# Patient Record
Sex: Male | Born: 1955 | Race: White | Hispanic: No | Marital: Single | Smoking: Former smoker
Health system: Southern US, Community
[De-identification: ages and names within clinical notes are randomized; demographics above are authoritative.]

## PROBLEM LIST (undated history)

## (undated) DIAGNOSIS — Z87442 Personal history of urinary calculi: Secondary | ICD-10-CM

## (undated) DIAGNOSIS — M1990 Unspecified osteoarthritis, unspecified site: Secondary | ICD-10-CM

## (undated) DIAGNOSIS — M4807 Spinal stenosis, lumbosacral region: Secondary | ICD-10-CM

## (undated) HISTORY — PX: JOINT REPLACEMENT: SHX530

## (undated) HISTORY — PX: OTHER SURGICAL HISTORY: SHX169

## (undated) HISTORY — PX: TONSILLECTOMY: SUR1361

## (undated) HISTORY — DX: Spinal stenosis, lumbosacral region: M48.07

---

## 2013-08-08 DIAGNOSIS — E785 Hyperlipidemia, unspecified: Secondary | ICD-10-CM | POA: Insufficient documentation

## 2018-02-25 ENCOUNTER — Encounter: Payer: Self-pay | Admitting: Sports Medicine

## 2018-02-25 ENCOUNTER — Ambulatory Visit (INDEPENDENT_AMBULATORY_CARE_PROVIDER_SITE_OTHER): Payer: Commercial Managed Care - PPO

## 2018-02-25 ENCOUNTER — Other Ambulatory Visit: Payer: Self-pay | Admitting: Sports Medicine

## 2018-02-25 ENCOUNTER — Telehealth: Payer: Self-pay | Admitting: Sports Medicine

## 2018-02-25 ENCOUNTER — Ambulatory Visit: Payer: Commercial Managed Care - PPO | Admitting: Sports Medicine

## 2018-02-25 ENCOUNTER — Other Ambulatory Visit: Payer: Self-pay

## 2018-02-25 VITALS — BP 145/100 | HR 84 | Resp 16 | Ht 79.0 in | Wt 260.0 lb

## 2018-02-25 DIAGNOSIS — M79672 Pain in left foot: Secondary | ICD-10-CM

## 2018-02-25 DIAGNOSIS — S92902A Unspecified fracture of left foot, initial encounter for closed fracture: Secondary | ICD-10-CM

## 2018-02-25 DIAGNOSIS — S92902K Unspecified fracture of left foot, subsequent encounter for fracture with nonunion: Secondary | ICD-10-CM

## 2018-02-25 DIAGNOSIS — M19072 Primary osteoarthritis, left ankle and foot: Secondary | ICD-10-CM | POA: Diagnosis not present

## 2018-02-25 DIAGNOSIS — T8484XA Pain due to internal orthopedic prosthetic devices, implants and grafts, initial encounter: Secondary | ICD-10-CM

## 2018-02-25 NOTE — Progress Notes (Addendum)
Subjective: Todd May is a 62 y.o. male patient who presents to office for evaluation of left foot pain. Patient complains of progressive pain especially over the last 4 years reports that his foot was fused by podiatrist from [place] and states that over the last 2-1/2 years the pain has slowly worsened from 4-6 to now 8-10 out of 10 pain states that the pain is sharp and constant and is starting to interfere with his daily activities even work states that overall he enjoys being very active and does not want to slow down but he feels like the pain in his foot and the broken hardware that he has is causing more pain and he is concerned and feels like he needs to get the area repaired.  Patient reports that he also had a shoulder issue which when he was given a medication for to help with pain that seem to help and is requesting me to give him something today for pain.  Patient currently is taking diclofenac which helps some but states that when the pain gets severe he feels like he needs something else to help with his pain.  Review of Systems  Musculoskeletal: Positive for joint pain and myalgias.  All other systems reviewed and are negative.   There are no active problems to display for this patient.   No current outpatient medications on file prior to visit.   No current facility-administered medications on file prior to visit.     Not on File  Objective:  General: Alert and oriented x3 in no acute distress  Dermatology: Surgical scar well-healed, no open lesions bilateral lower extremities, no webspace macerations, no ecchymosis bilateral, all nails x 10 are well manicured.  Vascular: Dorsalis Pedis and Posterior Tibial pedal pulses palpable, Capillary Fill Time 5 seconds, scant pedal hair growth bilateral, no edema bilateral lower extremities, Temperature gradient within normal limits.  Neurology: Michaell Cowing sensation intact via light touch bilateral.  Musculoskeletal: Mild  tenderness with palpation dorsal medial midfoot on left, no pain with calf compression bilateral. There is decreased midfoot range of motion with dorsal prominence.  Strength within normal limits in all groups bilateral.   Gait: Antalgic gait  Xrays  Left foot   Impression: Hardware dorsal midfoot with 2 broken screws distally within his plate there is dorsal and medial bone spurs around the area of hardware and an inferior calcaneal spur present.  Mild soft tissue swelling.  No other acute findings.  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, subsequent encounter       Arthritis of left foot           -Complete examination performed -Xrays reviewed -Discussed treatement options for pain around previous surgical site with a likely possible nonunion bone spurs and arthritis -Rx CT with 3D reconstruction advised patient based on the results of this will determine what type of procedure he may benefit best from could be simple hardware removal to revision with refusion of the surgical site -Advised patient to continue with diclofenac rest ice elevation and good supportive shoes; patient may call office with the name of the other medication that the other doctor gave him for the shoulder that seem to help his foot pain for me to review to see if this is something that I am comfortable with refilling  for him; Discussed that I can not give a Narcotic for his pain; offered Tramadol or changing anti-inflammatory but he refused and said it doesn't work -Patient to return to office after CT or sooner if condition worsens.  Asencion Islamitorya Rita Prom, DPM

## 2018-02-25 NOTE — Telephone Encounter (Signed)
That is a Narcotic. I told patient that I could not fill narcotic but I did offer tramadol. I told him that narcotic was reserved for surgery patients not for patient that has had pain for years. -Dr. Marylene LandStover

## 2018-02-25 NOTE — Progress Notes (Signed)
   Subjective:    Patient ID: Todd May, male    DOB: December 03, 1955, 62 y.o.   MRN: [id number]  HPI    Review of Systems  Musculoskeletal: Positive for arthralgias, joint swelling and myalgias.  All other systems reviewed and are negative.      Objective:   Physical Exam        Assessment & Plan:

## 2018-02-25 NOTE — Telephone Encounter (Signed)
"   The medication I took in the past was, Dilaudid  2mg ., I had 30 pills.  I would like to have it filled at [facility] [address] in [place].

## 2018-02-28 ENCOUNTER — Telehealth: Payer: Self-pay | Admitting: *Deleted

## 2018-02-28 ENCOUNTER — Telehealth: Payer: Self-pay | Admitting: Sports Medicine

## 2018-02-28 DIAGNOSIS — M19079 Primary osteoarthritis, unspecified ankle and foot: Secondary | ICD-10-CM

## 2018-02-28 NOTE — Telephone Encounter (Signed)
Pt had X-rays done Friday and was supposed to have prescription sent in for pain and pharmacy has not received the prescription. Please give pt a call

## 2018-02-28 NOTE — Telephone Encounter (Signed)
Orders to J. Quintana, RN for pre-cert. 

## 2018-02-28 NOTE — Telephone Encounter (Signed)
error 

## 2018-02-28 NOTE — Telephone Encounter (Signed)
-----   Message from Asencion Islamitorya Stover, [place] sent at 02/25/2018  8:47 AM EST ----- Regarding: CT scan with 3D reconstruction History of hardware at left midfoot with continued pain 8-10 out of 10 was fused about 4 years ago with no improvement in symptoms please evaluate for complete fusion at midfoot

## 2018-02-28 NOTE — Telephone Encounter (Signed)
I informed pt of 02/25/2018 message of Telephone Call from Dr. Marylene LandStover, that she would not prescribe the dilaudid, and she would send the tramadol. Pt states he told Dr. Marylene LandStover the dilaudid was from another doctor for his shoulder and that was what he last had, the tramadol doesn't help and he had had hydrocodone before. Pt states his foot is killing him and he wants something so he doesn't hurt. I told pt I would inform Dr. Marylene LandStover, and call again.

## 2018-03-01 NOTE — Telephone Encounter (Signed)
Returned patient phone call. Patient requesting pain medication. I told patient that I can not give a NARCOTIC for his chronic pain related to hardware that was placed years ago. Patient continues to talk and say that I am calling him a liar and that I looked in his chart and saw that 4 years ago he was on pain medications. I re-iterated to the patient that I again can only give a anti-inflammatory or Tramadol for the pain. Patient continued to insult me and the profession. I re-stated to patient that for his pain I can not give him a NARCOTIC. I told patient if his pain is that severe he can go to ER or Urgent care. Politely I told patient to have a good day and ended the conversation.

## 2018-03-04 NOTE — Telephone Encounter (Signed)
I called to inform pt of the CT. Pt states he can't do it, he doesn't trust Dr. Marylene LandStover and he doesn't think she knows what she is doing. I told pt, I had heard enough and would cancel his procedure, and I hung up the phone.

## 2018-03-04 NOTE — Telephone Encounter (Signed)
[facility]aret[facility] scheduled pt for CT of left lower extremity on 03/08/2018 arrival 2:15pm for 2:45pm testing.

## 2018-03-04 NOTE — Telephone Encounter (Signed)
I cancelled the procedure at [facility] with Jamesetta OrleansEricka.

## 2018-05-06 ENCOUNTER — Other Ambulatory Visit: Payer: Self-pay | Admitting: Orthopedic Surgery

## 2018-05-06 DIAGNOSIS — M545 Low back pain, unspecified: Secondary | ICD-10-CM

## 2018-05-09 ENCOUNTER — Other Ambulatory Visit: Payer: Self-pay | Admitting: Orthopedic Surgery

## 2018-05-11 ENCOUNTER — Other Ambulatory Visit: Payer: Self-pay

## 2018-05-16 ENCOUNTER — Other Ambulatory Visit: Payer: Self-pay

## 2018-06-10 ENCOUNTER — Telehealth: Payer: Self-pay | Admitting: Neurology

## 2018-06-10 ENCOUNTER — Ambulatory Visit: Payer: Commercial Managed Care - PPO | Admitting: Neurology

## 2018-06-10 ENCOUNTER — Other Ambulatory Visit: Payer: Self-pay

## 2018-06-10 ENCOUNTER — Encounter: Payer: Self-pay | Admitting: Neurology

## 2018-06-10 VITALS — BP 153/95 | HR 85 | Ht 77.0 in | Wt 249.0 lb

## 2018-06-10 DIAGNOSIS — M21372 Foot drop, left foot: Secondary | ICD-10-CM

## 2018-06-10 DIAGNOSIS — M6281 Muscle weakness (generalized): Secondary | ICD-10-CM

## 2018-06-10 DIAGNOSIS — M21371 Foot drop, right foot: Secondary | ICD-10-CM

## 2018-06-10 NOTE — Telephone Encounter (Signed)
I called the patient.  The MRI of the cervical and thoracic spine did not show evidence of spinal cord impingement.  The lower extremity weakness is likely related to the lumbosacral spinal stenosis.  He will be worked in next week for EMG evaluation.

## 2018-06-10 NOTE — Progress Notes (Signed)
Reason for visit: Lumbosacral spinal stenosis, leg weakness  Referring physician: Dr. Roseanna Rainbow is a 63 y.o. male  History of present illness:  Mr. Pennick is a 63 year old right-handed white male with a history of onset of right shoulder pain and discomfort that occurred in August 2019 after he loaded some luggage into the car.  The patient heard a pop in the shoulder with some pain in the shoulder and in between the shoulder blade.  The patient denies any neck pain per se.  The patient seemed to recover from this, but in November 2019 he was putting in a fire pit and he began noticed some numbness and weakness of the legs.  The patient has had gradual worsening of the leg strength, he has developed bilateral foot drops and has weakness with hip flexion bilaterally.  The patient underwent lumbar MRI study that showed evidence of severe lumbosacral spinal stenosis at L2-3 and L3-4 levels and moderate stenosis at L4-5 levels.  Multilevel foraminal stenosis was noted.  The actual MRI disc is not available for my review.  The patient has been sent for MRI of the cervical and thoracic spine, there does appear to be a disc bulge and spinal stenosis at the C4-5 level.  The formal report not available, I do not see definite spinal cord injury at this level.  Evaluation of the thoracic spine by my review shows no evidence of spinal cord impingement.  The patient has had ongoing weakness of the legs, he reports 1 fall.  He reports some weakness and discomfort in the legs with walking, he has to rest frequently.  He has numbness in both legs.  He does have some mild change in his bowel bladder, he has some mild constipation, but no incontinence.  The patient is sent to this office for further evaluation.   History reviewed. No pertinent past medical history.  Past Surgical History:  Procedure Laterality Date  . arthroscopic knee surgery left    . TONSILLECTOMY      Family History   Problem Relation Age of Onset  . Cancer - Colon Father   . Obesity Sister   . Heart disease Sister     Social history:  reports that he has never smoked. He has never used smokeless tobacco. No history on file for alcohol and drug.  Medications:  Prior to Admission medications   Medication Sig Start Date End Date Taking? Authorizing Provider  acetaminophen-codeine (TYLENOL #3) 300-30 MG tablet Take 1 tablet by mouth 4 (four) times daily as needed. 06/01/18   [provider]  amitriptyline (ELAVIL) 25 MG tablet  02/07/18   [provider]  cyclobenzaprine (FLEXERIL) 5 MG tablet Take 5 mg by mouth daily. 05/12/18   [provider]  diclofenac (VOLTAREN) 75 MG EC tablet  02/07/18   [provider]  meloxicam (MOBIC) 15 MG tablet Take 15 mg by mouth daily. 05/23/18   [provider]  methocarbamol (ROBAXIN) 500 MG tablet Take 500 mg by mouth 3 (three) times daily. 05/25/18   [provider]     No Known Allergies  ROS:  Out of a complete 14 system review of symptoms, the patient complains only of the following symptoms, and all other reviewed systems are negative.  Joint pain, muscle cramps, aching muscles Insomnia, restless legs  Blood pressure (!) 153/95, pulse 85, height 6\' 5"  (1.956 m), weight 249 lb (112.9 kg).  Physical Exam  General: The patient is alert  and cooperative at the time of the examination.  Eyes: Pupils are equal, round, and reactive to light. Discs are flat bilaterally.  Neck: The neck is supple, no carotid bruits are noted.  Respiratory: The respiratory examination is clear.  Cardiovascular: The cardiovascular examination reveals a regular rate and rhythm, no obvious murmurs or rubs are noted.  Skin: Extremities are without significant edema.  Neurologic Exam  Mental status: The patient is alert and oriented x 3 at the time of the examination. The patient has apparent normal recent and remote memory, with  an apparently normal attention span and concentration ability.  Cranial nerves: Facial symmetry is present. There is good sensation of the face to pinprick and soft touch bilaterally. The strength of the facial muscles and the muscles to head turning and shoulder shrug are normal bilaterally. Speech is well enunciated, no aphasia or dysarthria is noted. Extraocular movements are full. Visual fields are full. The tongue is midline, and the patient has symmetric elevation of the soft palate. No obvious hearing deficits are noted.  Motor: The motor testing reveals 5 over 5 strength of the upper extremities, with exception of some weakness with external rotation of the right arm.  With the lower extremities, there is 4/5 strength with hip flexion bilaterally and prominent foot drops with weakness with plantar flexion, inversion, and eversion of the feet as well.  Flexion and extension of the knees is relatively good strength.  Good symmetric motor tone is noted throughout.  Sensory: Sensory testing is intact to pinprick, soft touch, vibration sensation, and position sense on all 4 extremities, with exception some decreased position sense in the right foot. No evidence of extinction is noted.  Coordination: Cerebellar testing reveals good finger-nose-finger and heel-to-shin bilaterally.  Gait and station: Gait is wide-based, slightly unsteady.  The patient has a steppage gait pattern bilaterally.  Reflexes: Deep tendon reflexes are symmetric, but are depressed bilaterally.  The patient has maintained but depressed reflexes in the knees bilaterally, ankle jerk reflexes are absent bilaterally.  Toes are downgoing bilaterally.   Assessment/Plan:  1.  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  Progressive lower extremity weakness and numbness, likely related to lumbosacral spinal stenosis  The MRI of the thoracic spine and cervical spine I do not believe shows an etiology for his  current symptoms.  The patient likely has progressive weakness related to the lumbosacral spinal stenosis and likely will require decompressive surgery.  The patient will be set up for some blood work today, he will have EMG and nerve conduction study evaluation.  Addendum: I have received the written reports of MRI of the cervical and thoracic spine.  MRI of the thoracic spine shows a disc protrusion at the T9-T10 level effacing the thecal sac, no evidence of spinal cord compression was seen.  MRI of the cervical spine reveals evidence of a disc osteophyte complex at the C4-5 level that is causing moderate spinal stenosis with severe left and moderate right neuroforaminal stenosis.  There is severe bilateral neuroforaminal stenosis at the C3-4 level and moderate neuroforaminal stenosis at the C5-6 level on the right, mild on the left.  There is severe right and mild left neuroforaminal stenosis at the C6-7 level and moderate left neuroforaminal stenosis at the C2-3 level.  There is no evidence of spinal cord compression.   Marlan Palau MD 06/10/2018 11:17 AM  [facility] [address] [place], [place] [address]  Phone [phone] Fax  [phone]  

## 2018-06-13 LAB — MULTIPLE MYELOMA PANEL, SERUM
Albumin SerPl Elph-Mcnc: 3.9 g/dL (ref 2.9–4.4)
Albumin/Glob SerPl: 1.4 (ref 0.7–1.7)
Alpha 1: 0.3 g/dL (ref 0.0–0.4)
Alpha2 Glob SerPl Elph-Mcnc: 0.8 g/dL (ref 0.4–1.0)
B-Globulin SerPl Elph-Mcnc: 1.2 g/dL (ref 0.7–1.3)
Gamma Glob SerPl Elph-Mcnc: 0.8 g/dL (ref 0.4–1.8)
Globulin, Total: 3 g/dL (ref 2.2–3.9)
IgA/Immunoglobulin A, Serum: 193 mg/dL (ref 61–437)
IgG (Immunoglobin G), Serum: 776 mg/dL (ref 700–1600)
IgM (Immunoglobulin M), Srm: 144 mg/dL (ref 20–172)
TOTAL PROTEIN: 6.9 g/dL (ref 6.0–8.5)

## 2018-06-13 LAB — ANA W/REFLEX: ANA: NEGATIVE

## 2018-06-13 LAB — B. BURGDORFI ANTIBODIES: Lyme IgG/IgM Ab: <0.91 {ISR} (ref 0.00–0.90)

## 2018-06-13 LAB — VITAMIN B12: Vitamin B-12: 1750 pg/mL — ABNORMAL HIGH (ref 232–1245)

## 2018-06-13 LAB — SEDIMENTATION RATE: SED RATE: 22 mm/h (ref 0–30)

## 2018-06-13 LAB — CK: Total CK: 298 U/L — ABNORMAL HIGH (ref 24–204)

## 2018-06-14 ENCOUNTER — Ambulatory Visit: Payer: Commercial Managed Care - PPO | Admitting: Neurology

## 2018-06-14 ENCOUNTER — Encounter: Payer: Self-pay | Admitting: Neurology

## 2018-06-14 ENCOUNTER — Other Ambulatory Visit: Payer: Self-pay

## 2018-06-14 ENCOUNTER — Ambulatory Visit (INDEPENDENT_AMBULATORY_CARE_PROVIDER_SITE_OTHER): Payer: Commercial Managed Care - PPO | Admitting: Neurology

## 2018-06-14 DIAGNOSIS — M4807 Spinal stenosis, lumbosacral region: Secondary | ICD-10-CM | POA: Insufficient documentation

## 2018-06-14 DIAGNOSIS — M6281 Muscle weakness (generalized): Secondary | ICD-10-CM | POA: Diagnosis not present

## 2018-06-14 HISTORY — DX: Spinal stenosis, lumbosacral region: M48.07

## 2018-06-14 NOTE — Procedures (Signed)
     HISTORY:  Todd May is a 63 year old gentleman with a history of onset of weakness in both legs.  He has been found to have a 2 level issue with spinal stenosis in the lumbar spine, the patient is being evaluated for this issue.  NERVE CONDUCTION STUDIES:  Nerve conduction studies were performed on both lower extremities.  The distal motor latencies for the peroneal and posterior tibial nerves were prolonged bilaterally with low motor amplitudes seen for these nerves bilaterally.  The nerve conduction velocities shows significant slowing for the peroneal and posterior tibial nerves bilaterally.  The sensory latencies for the sural nerves were borderline normal on the right and slightly prolonged on the left with prolongation of the right peroneal sensory latency and an unobtainable left peroneal sensory latency.  The F-wave latencies for the posterior tibial nerves were prolonged bilaterally.  EMG STUDIES:  EMG study was performed on the right lower extremity:  The tibialis anterior muscle reveals 2 to 4K motor units with decreased recruitment. 1+ fibrillations and positive waves were seen. The peroneus tertius muscle reveals 2 to 5K motor units with decreased recruitment. 2+ fibrillations and positive waves were seen. The medial gastrocnemius muscle reveals 1 to 4K motor units with decreased recruitment. 2+ fibrillations and positive waves were seen. The vastus lateralis muscle reveals 2 to 4K motor units with decreased recruitment. No fibrillations or positive waves were seen. The iliopsoas muscle reveals 2 to 5K motor units with slightly decreased recruitment. No fibrillations or positive waves were seen. The biceps femoris muscle (long head) reveals 2 to 4K motor units with decreased recruitment. 2+ fibrillations or positive waves were seen. The lumbosacral paraspinal muscles were tested at 3 levels, and revealed no abnormalities of insertional activity at all 3 levels tested.  There was good relaxation.   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  EMG evaluation of the right lower extremity shows denervation that is acute and chronic in the S1 level primarily, also involving the L5 level to some degree.  Less severe but chronic neuropathic denervation is also seen in the L4 and L3 levels.  EMG evaluation again would be consistent with lumbosacral spinal stenosis.   Todd Palau MD 06/14/2018 2:42 PM  [facility] [address] [place], [place] [address]  Phone [phone] Fax [phone]

## 2018-06-14 NOTE — Progress Notes (Signed)
The patient comes in for EMG nerve conduction studies today.  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  Overall, the study is most consistent with the findings associated with the spinal stenosis.  Blood work is unremarkable with exception that the CK enzyme levels were mildly elevated likely related to acute active neuropathic denervation.  The patient is indicating that he is having some change in bowel and bladder function recently, considering decompressive surgery of the lumbosacral spinal stenosis is therefore indicated.      MNC    Nerve / Sites Muscle Latency Ref. Amplitude Ref. Rel Amp Segments Distance Velocity Ref. Area    ms ms mV mV %  cm m/s m/s mVms  R Peroneal - EDB     Ankle EDB 7.8 ?6.5 0.4 ?2.0 100 Ankle - EDB 9   1.9     Fib head EDB 18.8  0.3  81.4 Fib head - Ankle 35 32 ?44 2.0     Pop fossa EDB 22.0  0.5  169 Pop fossa - Fib head 10 31 ?44 4.0         Pop fossa - Ankle      L Peroneal - EDB     Ankle EDB 8.6 ?6.5 0.3 ?2.0 100 Ankle - EDB 9   1.4     Fib head EDB 21.8  0.2  78.5 Fib head - Ankle 34 26 ?44 0.9     Pop fossa EDB 25.8  0.3  141 Pop fossa - Fib head 10 25 ?44 1.0         Pop fossa - Ankle      R Tibial - AH     Ankle AH 9.2 ?5.8 0.8 ?4.0 100 Ankle - AH 9   3.0     Pop fossa AH 23.5  0.6  72.7 Pop fossa - Ankle 45 32 ?41 1.6  L Tibial - AH     Ankle AH 7.4 ?5.8 1.7 ?4.0 100 Ankle - AH 9   5.6     Pop fossa AH 20.8  1.6  97.1 Pop fossa - Ankle 42 31 ?41 4.9             SNC    Nerve / Sites Rec. Site Peak Lat Ref.  Amp Ref. Segments Distance    ms ms V V  cm  R Sural - Ankle (Calf)     Calf Ankle 4.4 ?4.4 8 ?6 Calf - Ankle 14  L Sural - Ankle (Calf)     Calf Ankle 4.8 ?4.4 5 ?6 Calf - Ankle 14  R Superficial peroneal - Ankle     Lat leg Ankle 5.2 ?4.4 3 ?6 Lat leg - Ankle 14  L Superficial peroneal  - Ankle     Lat leg Ankle NR ?4.4 NR ?6 Lat leg - Ankle 14              F  Wave    Nerve F Lat Ref.   ms ms  R Tibial - AH 85.0 ?56.0  L Tibial - AH 66.8 ?56.0

## 2018-06-14 NOTE — Progress Notes (Signed)
Please refer to EMG and nerve conduction procedure note.  

## 2018-06-20 ENCOUNTER — Other Ambulatory Visit: Payer: Self-pay | Admitting: Orthopedic Surgery

## 2018-06-22 NOTE — Pre-Procedure Instructions (Signed)
Todd May  06/22/2018        Your procedure is scheduled on Wednesday 06/29/18 from 0830-1420pm  Report to [facility] ,Entrance A at 0630 A.M.  Call this number if you have problems the morning of surgery:  [phone]   Remember:  Do not eat  after midnight.  You may drink clear liquids until 0530 AM.  Clear liquids allowed are: Water, Juice (non-citric and without pulp), Carbonated beverages, Clear Tea, Black Coffee only, Plain Jell-O only, Gatorade and Plain Popsicles only. Complete the Ensure Pre-surgery drink by 05:30am. Please, if able, drink it in one setting. DO NOT SIP.   Take these medicines the morning of surgery with A SIP OF WATER :              Methocarbamol (ROBAXIN)              As needed : Acetaminophen-codeine (tylenol #3)  As of today  STOP taking Diclofenac(VOLTAREN),  Meloxicam (MOBIC) , Aspirin (unless otherwise instructed by your surgeon), Aleve, Naproxen, Ibuprofen, Motrin, Advil, Goody's, BC's, all herbal medications, fish oil, and all vitamins.    Do not wear jewelry, make-up or nail polish.  Do not wear lotions, powders, or perfumes, or deodorant.  Do not shave 48 hours prior to surgery.  Men may shave face and neck.  Do not bring valuables to the hospital.  [facility] is not responsible for any belongings or valuables.  Contacts, dentures or bridgework may not be worn into surgery.  Leave your suitcase in the car.  After surgery it may be brought to your room.  For patients admitted to the hospital, discharge time will be determined by your treatment team.  Patients discharged the day of surgery will not be allowed to drive home.   Special instructions: [facility]- Preparing For Surgery  Before surgery, you can play an important role. Because skin is not sterile, your skin needs to be as free of germs as possible. You can reduce the number of germs on your skin by washing with CHG (chlorahexidine gluconate) Soap before surgery.   CHG is an antiseptic cleaner which kills germs and bonds with the skin to continue killing germs even after washing.    Oral Hygiene is also important to reduce your risk of infection.  Remember - BRUSH YOUR TEETH THE MORNING OF SURGERY WITH YOUR REGULAR TOOTHPASTE  Please do not use if you have an allergy to CHG or antibacterial soaps. If your skin becomes reddened/irritated stop using the CHG.  Do not shave (including legs and underarms) for at least 48 hours prior to first CHG shower. It is OK to shave your face.  Please follow these instructions carefully.   1. Shower the NIGHT BEFORE SURGERY and the MORNING OF SURGERY with CHG.   2. If you chose to wash your hair, wash your hair first as usual with your normal shampoo.  3. After you shampoo, rinse your hair and body thoroughly to remove the shampoo.  4. Use CHG as you would any other liquid soap. You can apply CHG directly to the skin and wash gently with a scrungie or a clean washcloth.   5. Apply the CHG Soap to your body ONLY FROM THE NECK DOWN.  Do not use on open wounds or open sores. Avoid contact with your eyes, ears, mouth and genitals (private parts). Wash Face and genitals (private parts)  with your normal soap.  6. Wash thoroughly, paying special attention to the  area where your surgery will be performed.  7. Thoroughly rinse your body with warm water from the neck down.  8. DO NOT shower/wash with your normal soap after using and rinsing off the CHG Soap.  9. Pat yourself dry with a CLEAN TOWEL.  10. Wear CLEAN PAJAMAS to bed the night before surgery, wear comfortable clothes the morning of surgery  11. Place CLEAN SHEETS on your bed the night of your first shower and DO NOT SLEEP WITH PETS.  Day of Surgery:  Do not apply any deodorants/lotions.  Please wear clean clothes to the hospital/surgery center.   Remember to brush your teeth WITH YOUR REGULAR TOOTHPASTE.  Please read over the following fact sheets that  you were given. Pain Booklet, MRSA Information and Surgical Site Infection Prevention

## 2018-06-24 ENCOUNTER — Other Ambulatory Visit: Payer: Self-pay

## 2018-06-24 ENCOUNTER — Encounter (HOSPITAL_COMMUNITY): Payer: Self-pay

## 2018-06-24 ENCOUNTER — Encounter (HOSPITAL_COMMUNITY)
Admission: RE | Admit: 2018-06-24 | Discharge: 2018-06-24 | Disposition: A | Discharge status: Home / Self Care | Payer: Commercial Managed Care - PPO | Source: Ambulatory Visit | Attending: Orthopedic Surgery | Admitting: Orthopedic Surgery

## 2018-06-24 DIAGNOSIS — Z01812 Encounter for preprocedural laboratory examination: Secondary | ICD-10-CM | POA: Insufficient documentation

## 2018-06-24 HISTORY — DX: Personal history of urinary calculi: Z87.442

## 2018-06-24 HISTORY — DX: Unspecified osteoarthritis, unspecified site: M19.90

## 2018-06-24 LAB — CBC WITH DIFFERENTIAL/PLATELET
Abs Immature Granulocytes: 0.03 10*3/uL (ref 0.00–0.07)
Basophils Absolute: 0 10*3/uL (ref 0.0–0.1)
Basophils Relative: 0 %
Eosinophils Absolute: 0.1 10*3/uL (ref 0.0–0.5)
Eosinophils Relative: 1 %
HCT: 50.5 % (ref 39.0–52.0)
Hemoglobin: 15.9 g/dL (ref 13.0–17.0)
Immature Granulocytes: 0 %
Lymphocytes Relative: 13 %
Lymphs Abs: 1 10*3/uL (ref 0.7–4.0)
MCH: 27.6 pg (ref 26.0–34.0)
MCHC: 31.5 g/dL (ref 30.0–36.0)
MCV: 87.5 fL (ref 80.0–100.0)
Monocytes Absolute: 0.5 10*3/uL (ref 0.1–1.0)
Monocytes Relative: 6 %
Neutro Abs: 6.2 10*3/uL (ref 1.7–7.7)
Neutrophils Relative %: 80 %
Platelets: 250 10*3/uL (ref 150–400)
RBC: 5.77 MIL/uL (ref 4.22–5.81)
RDW: 13.5 % (ref 11.5–15.5)
WBC: 7.9 10*3/uL (ref 4.0–10.5)
nRBC: 0 % (ref 0.0–0.2)

## 2018-06-24 LAB — COMPREHENSIVE METABOLIC PANEL
ALT: 28 U/L (ref 0–44)
AST: 27 U/L (ref 15–41)
Albumin: 4.3 g/dL (ref 3.5–5.0)
Alkaline Phosphatase: 98 U/L (ref 38–126)
Anion gap: 10 (ref 5–15)
BUN: 20 mg/dL (ref 8–23)
CO2: 25 mmol/L (ref 22–32)
Calcium: 9.1 mg/dL (ref 8.9–10.3)
Chloride: 104 mmol/L (ref 98–111)
Creatinine, Ser: 0.99 mg/dL (ref 0.61–1.24)
GFR calc Af Amer: >60 mL/min (ref >60)
GFR calc non Af Amer: >60 mL/min (ref >60)
Glucose, Bld: 100 mg/dL — ABNORMAL HIGH (ref 70–99)
Potassium: 4 mmol/L (ref 3.5–5.1)
Sodium: 139 mmol/L (ref 135–145)
Total Bilirubin: 0.7 mg/dL (ref 0.3–1.2)
Total Protein: 7.1 g/dL (ref 6.5–8.1)

## 2018-06-24 LAB — URINALYSIS, ROUTINE W REFLEX MICROSCOPIC
Bilirubin Urine: NEGATIVE
Glucose, UA: NEGATIVE mg/dL
Hgb urine dipstick: NEGATIVE
Ketones, ur: NEGATIVE mg/dL
Leukocytes,Ua: NEGATIVE
Nitrite: NEGATIVE
Protein, ur: NEGATIVE mg/dL
Specific Gravity, Urine: 1.025 (ref 1.005–1.030)
pH: 6 (ref 5.0–8.0)

## 2018-06-24 LAB — ABO/RH: ABO/RH(D): B POS

## 2018-06-24 LAB — TYPE AND SCREEN
ABO/RH(D): B POS
Antibody Screen: NEGATIVE

## 2018-06-24 LAB — PROTIME-INR
INR: 1 (ref 0.8–1.2)
Prothrombin Time: 12.9 seconds (ref 11.4–15.2)

## 2018-06-24 LAB — SURGICAL PCR SCREEN
MRSA, PCR: NEGATIVE
Staphylococcus aureus: NEGATIVE

## 2018-06-24 LAB — APTT: aPTT: 28 seconds (ref 24–36)

## 2018-06-24 NOTE — Progress Notes (Signed)
PCP - denies Cardiologist - denies  Chest x-ray -  N/A EKG - N/A Stress Test - denies ECHO - denies Cardiac Cath - denies  Sleep Study - denies CPAP - N/a  Blood Thinner Instructions: N/A Aspirin Instructions: per pt. 06/24/2018  Anesthesia review: No  Coronavirus Screening  Have you experienced the following symptoms:  Cough yes/no: No Fever (>100.73F)  yes/no: No Runny nose yes/no: No Sore throat yes/no: No Difficulty breathing/shortness of breath  yes/no: No  Have you or a family member traveled in the last 14 days and where? yes/no: No  If the patient indicates "YES" to the above questions, their PAT will be rescheduled to limit the exposure to others and, the surgeon will be notified. THE PATIENT WILL NEED TO BE ASYMPTOMATIC FOR 14 DAYS.   If the patient is not experiencing any of these symptoms, the PAT nurse will instruct them to NOT bring anyone with them to their appointment since they may have these symptoms or traveled as well.   Please remind your patients and families that hospital visitation restrictions are in effect and the importance of the restrictions.   Patient denies shortness of breath, fever, cough and chest pain at PAT appointment  Patient verbalized understanding of instructions that were given to them at the PAT appointment. Patient was also instructed that they will need to review over the PAT instructions again at home before surgery.

## 2018-06-29 ENCOUNTER — Ambulatory Visit (HOSPITAL_COMMUNITY): Payer: Commercial Managed Care - PPO | Admitting: Vascular Surgery

## 2018-06-29 ENCOUNTER — Encounter (HOSPITAL_COMMUNITY): Payer: Self-pay

## 2018-06-29 ENCOUNTER — Ambulatory Visit (HOSPITAL_COMMUNITY)
Admission: RE | Admit: 2018-06-29 | Discharge: 2018-06-30 | Disposition: A | Discharge status: Home / Self Care | Payer: Commercial Managed Care - PPO | Attending: Orthopedic Surgery | Admitting: Orthopedic Surgery

## 2018-06-29 ENCOUNTER — Other Ambulatory Visit: Payer: Self-pay

## 2018-06-29 ENCOUNTER — Ambulatory Visit (HOSPITAL_COMMUNITY)
Admission: RE | Disposition: A | Discharge status: Home / Self Care | Payer: Self-pay | Source: Home / Self Care | Attending: Orthopedic Surgery

## 2018-06-29 ENCOUNTER — Ambulatory Visit (HOSPITAL_COMMUNITY): Payer: Commercial Managed Care - PPO | Admitting: Anesthesiology

## 2018-06-29 ENCOUNTER — Ambulatory Visit (HOSPITAL_COMMUNITY): Payer: Commercial Managed Care - PPO

## 2018-06-29 DIAGNOSIS — Z79899 Other long term (current) drug therapy: Secondary | ICD-10-CM | POA: Diagnosis not present

## 2018-06-29 DIAGNOSIS — Z87891 Personal history of nicotine dependence: Secondary | ICD-10-CM | POA: Insufficient documentation

## 2018-06-29 DIAGNOSIS — M1990 Unspecified osteoarthritis, unspecified site: Secondary | ICD-10-CM | POA: Diagnosis not present

## 2018-06-29 DIAGNOSIS — M48061 Spinal stenosis, lumbar region without neurogenic claudication: Secondary | ICD-10-CM | POA: Insufficient documentation

## 2018-06-29 DIAGNOSIS — Z7982 Long term (current) use of aspirin: Secondary | ICD-10-CM | POA: Insufficient documentation

## 2018-06-29 DIAGNOSIS — M5410 Radiculopathy, site unspecified: Secondary | ICD-10-CM

## 2018-06-29 DIAGNOSIS — Z791 Long term (current) use of non-steroidal anti-inflammatories (NSAID): Secondary | ICD-10-CM | POA: Diagnosis not present

## 2018-06-29 DIAGNOSIS — M4800 Spinal stenosis, site unspecified: Secondary | ICD-10-CM | POA: Diagnosis present

## 2018-06-29 HISTORY — PX: LUMBAR LAMINECTOMY/DECOMPRESSION MICRODISCECTOMY: SHX5026

## 2018-06-29 SURGERY — LUMBAR LAMINECTOMY/DECOMPRESSION MICRODISCECTOMY
Anesthesia: General | Site: Spine Lumbar

## 2018-06-29 MED ORDER — MENTHOL 3 MG MT LOZG: 1.0000 | LOZENGE | OROMUCOSAL | Status: DC | PRN

## 2018-06-29 MED ORDER — METHYLENE BLUE 0.5 % INJ SOLN
INTRAVENOUS | Status: DC | PRN
  Administered 2018-06-29: 1 mL

## 2018-06-29 MED ORDER — CEFAZOLIN SODIUM-DEXTROSE 2-4 GM/100ML-% IV SOLN
2.0000 g | Freq: Three times a day (TID) | INTRAVENOUS | Status: AC
  Administered 2018-06-29 – 2018-06-30 (×2): 2 g via INTRAVENOUS
  Filled 2018-06-29 (×3): qty 100

## 2018-06-29 MED ORDER — PANTOPRAZOLE SODIUM 40 MG IV SOLR
40.0000 mg | Freq: Every day | INTRAVENOUS | Status: DC
  Administered 2018-06-29: 40 mg via INTRAVENOUS
  Filled 2018-06-29: qty 40

## 2018-06-29 MED ORDER — 0.9 % SODIUM CHLORIDE (POUR BTL) OPTIME
TOPICAL | Status: DC | PRN
  Administered 2018-06-29 (×3): 1000 mL

## 2018-06-29 MED ORDER — PHENYLEPHRINE HCL 10 MG/ML IJ SOLN
INTRAMUSCULAR | Status: AC
  Filled 2018-06-29: qty 1

## 2018-06-29 MED ORDER — METHYLENE BLUE 0.5 % INJ SOLN
INTRAVENOUS | Status: AC
  Filled 2018-06-29: qty 10

## 2018-06-29 MED ORDER — FENTANYL CITRATE (PF) 250 MCG/5ML IJ SOLN
INTRAMUSCULAR | Status: AC
  Filled 2018-06-29: qty 5

## 2018-06-29 MED ORDER — PROMETHAZINE HCL 25 MG/ML IJ SOLN: 6.2500 mg | INTRAMUSCULAR | Status: DC | PRN

## 2018-06-29 MED ORDER — BUPIVACAINE-EPINEPHRINE 0.25% -1:200000 IJ SOLN
INTRAMUSCULAR | Status: DC | PRN
  Administered 2018-06-29: 21.5 mL
  Administered 2018-06-29: 8.5 mL

## 2018-06-29 MED ORDER — ONDANSETRON HCL 4 MG/2ML IJ SOLN: 4.0000 mg | Freq: Four times a day (QID) | INTRAMUSCULAR | Status: DC | PRN

## 2018-06-29 MED ORDER — DIAZEPAM 5 MG PO TABS
5.0000 mg | ORAL_TABLET | Freq: Four times a day (QID) | ORAL | Status: DC | PRN
  Administered 2018-06-29 – 2018-06-30 (×2): 5 mg via ORAL
  Filled 2018-06-29 (×2): qty 1

## 2018-06-29 MED ORDER — SODIUM CHLORIDE 0.9 % IV SOLN: 250.0000 mL | INTRAVENOUS | Status: DC

## 2018-06-29 MED ORDER — ACETAMINOPHEN 325 MG PO TABS
650.0000 mg | ORAL_TABLET | ORAL | Status: DC | PRN
  Administered 2018-06-29: 650 mg via ORAL
  Filled 2018-06-29: qty 2

## 2018-06-29 MED ORDER — CEFAZOLIN SODIUM 1 G IJ SOLR
INTRAMUSCULAR | Status: AC
  Filled 2018-06-29: qty 20

## 2018-06-29 MED ORDER — MIDAZOLAM HCL 2 MG/2ML IJ SOLN
INTRAMUSCULAR | Status: DC | PRN
  Administered 2018-06-29: 2 mg via INTRAVENOUS

## 2018-06-29 MED ORDER — SODIUM CHLORIDE 0.9 % IV SOLN
INTRAVENOUS | Status: DC | PRN
  Administered 2018-06-29: 20 ug/min via INTRAVENOUS

## 2018-06-29 MED ORDER — ONDANSETRON HCL 4 MG/2ML IJ SOLN
INTRAMUSCULAR | Status: DC | PRN
  Administered 2018-06-29: 4 mg via INTRAVENOUS

## 2018-06-29 MED ORDER — ROCURONIUM BROMIDE 50 MG/5ML IV SOSY
PREFILLED_SYRINGE | INTRAVENOUS | Status: AC
  Filled 2018-06-29: qty 5

## 2018-06-29 MED ORDER — THROMBIN (RECOMBINANT) 20000 UNITS EX SOLR
CUTANEOUS | Status: AC
  Filled 2018-06-29: qty 20000

## 2018-06-29 MED ORDER — CHLORHEXIDINE GLUCONATE 4 % EX LIQD: 60.0000 mL | Freq: Once | CUTANEOUS | Status: DC

## 2018-06-29 MED ORDER — CEFAZOLIN SODIUM-DEXTROSE 2-4 GM/100ML-% IV SOLN
2.0000 g | INTRAVENOUS | Status: AC
  Administered 2018-06-29 (×2): 2 g via INTRAVENOUS
  Filled 2018-06-29: qty 100

## 2018-06-29 MED ORDER — ONDANSETRON HCL 4 MG PO TABS: 4.0000 mg | ORAL_TABLET | Freq: Four times a day (QID) | ORAL | Status: DC | PRN

## 2018-06-29 MED ORDER — HYDROMORPHONE HCL 1 MG/ML IJ SOLN
0.2500 mg | INTRAMUSCULAR | Status: DC | PRN
  Administered 2018-06-29 (×2): 0.5 mg via INTRAVENOUS

## 2018-06-29 MED ORDER — SENNOSIDES-DOCUSATE SODIUM 8.6-50 MG PO TABS: 1.0000 | ORAL_TABLET | Freq: Every evening | ORAL | Status: DC | PRN

## 2018-06-29 MED ORDER — EPHEDRINE SULFATE-NACL 50-0.9 MG/10ML-% IV SOSY
PREFILLED_SYRINGE | INTRAVENOUS | Status: DC | PRN
  Administered 2018-06-29: 10 mg via INTRAVENOUS
  Administered 2018-06-29 (×2): 5 mg via INTRAVENOUS

## 2018-06-29 MED ORDER — OXYCODONE HCL 5 MG PO TABS: 5.0000 mg | ORAL_TABLET | Freq: Once | ORAL | Status: DC | PRN

## 2018-06-29 MED ORDER — LACTATED RINGERS IV SOLN
INTRAVENOUS | Status: DC | PRN
  Administered 2018-06-29 (×2): via INTRAVENOUS

## 2018-06-29 MED ORDER — METHYLPREDNISOLONE ACETATE 40 MG/ML IJ SUSP
INTRAMUSCULAR | Status: AC
  Filled 2018-06-29: qty 1

## 2018-06-29 MED ORDER — OXYCODONE HCL 5 MG/5ML PO SOLN: 5.0000 mg | Freq: Once | ORAL | Status: DC | PRN

## 2018-06-29 MED ORDER — GLYCOPYRROLATE 0.2 MG/ML IJ SOLN
INTRAMUSCULAR | Status: DC | PRN
  Administered 2018-06-29 (×2): 0.2 mg via INTRAVENOUS

## 2018-06-29 MED ORDER — ROCURONIUM BROMIDE 10 MG/ML (PF) SYRINGE
PREFILLED_SYRINGE | INTRAVENOUS | Status: DC | PRN
  Administered 2018-06-29 (×2): 10 mg via INTRAVENOUS
  Administered 2018-06-29: 30 mg via INTRAVENOUS
  Administered 2018-06-29 (×2): 20 mg via INTRAVENOUS
  Administered 2018-06-29 (×3): 10 mg via INTRAVENOUS
  Administered 2018-06-29: 20 mg via INTRAVENOUS
  Administered 2018-06-29: 30 mg via INTRAVENOUS

## 2018-06-29 MED ORDER — BUPIVACAINE-EPINEPHRINE (PF) 0.25% -1:200000 IJ SOLN
INTRAMUSCULAR | Status: AC
  Filled 2018-06-29: qty 30

## 2018-06-29 MED ORDER — PHENOL 1.4 % MT LIQD: 1.0000 | OROMUCOSAL | Status: DC | PRN

## 2018-06-29 MED ORDER — METHYLPREDNISOLONE ACETATE 40 MG/ML IJ SUSP
INTRAMUSCULAR | Status: DC | PRN
  Administered 2018-06-29: 40 mg

## 2018-06-29 MED ORDER — AMITRIPTYLINE HCL 25 MG PO TABS
25.0000 mg | ORAL_TABLET | Freq: Every day | ORAL | Status: DC
  Administered 2018-06-29: 25 mg via ORAL
  Filled 2018-06-29: qty 1

## 2018-06-29 MED ORDER — ZOLPIDEM TARTRATE 5 MG PO TABS: 5.0000 mg | ORAL_TABLET | Freq: Every evening | ORAL | Status: DC | PRN

## 2018-06-29 MED ORDER — FLEET ENEMA 7-19 GM/118ML RE ENEM: 1.0000 | ENEMA | Freq: Once | RECTAL | Status: DC | PRN

## 2018-06-29 MED ORDER — DOCUSATE SODIUM 100 MG PO CAPS
100.0000 mg | ORAL_CAPSULE | Freq: Two times a day (BID) | ORAL | Status: DC
  Administered 2018-06-29: 100 mg via ORAL
  Filled 2018-06-29: qty 1

## 2018-06-29 MED ORDER — MIDAZOLAM HCL 2 MG/2ML IJ SOLN
INTRAMUSCULAR | Status: AC
  Filled 2018-06-29: qty 2

## 2018-06-29 MED ORDER — OXYCODONE-ACETAMINOPHEN 5-325 MG PO TABS
1.0000 | ORAL_TABLET | ORAL | Status: DC | PRN
  Administered 2018-06-29: 1 via ORAL
  Administered 2018-06-29 – 2018-06-30 (×3): 2 via ORAL
  Administered 2018-06-30: 1 via ORAL
  Filled 2018-06-29 (×5): qty 2

## 2018-06-29 MED ORDER — PROPOFOL 10 MG/ML IV BOLUS
INTRAVENOUS | Status: AC
  Filled 2018-06-29: qty 40

## 2018-06-29 MED ORDER — BISACODYL 5 MG PO TBEC: 5.0000 mg | DELAYED_RELEASE_TABLET | Freq: Every day | ORAL | Status: DC | PRN

## 2018-06-29 MED ORDER — ARTIFICIAL TEARS OPHTHALMIC OINT
TOPICAL_OINTMENT | OPHTHALMIC | Status: DC | PRN
  Administered 2018-06-29: 1 via OPHTHALMIC

## 2018-06-29 MED ORDER — SUCCINYLCHOLINE 20MG/ML (10ML) SYRINGE FOR MEDFUSION PUMP - OPTIME
INTRAMUSCULAR | Status: DC | PRN
  Administered 2018-06-29: 120 mg via INTRAVENOUS

## 2018-06-29 MED ORDER — ACETAMINOPHEN 650 MG RE SUPP: 650.0000 mg | RECTAL | Status: DC | PRN

## 2018-06-29 MED ORDER — SUGAMMADEX SODIUM 200 MG/2ML IV SOLN
INTRAVENOUS | Status: DC | PRN
  Administered 2018-06-29: 200 mg via INTRAVENOUS

## 2018-06-29 MED ORDER — BUPIVACAINE LIPOSOME 1.3 % IJ SUSP
20.0000 mL | INTRAMUSCULAR | Status: AC
  Administered 2018-06-29: 13:00:00 20 mL
  Filled 2018-06-29: qty 20

## 2018-06-29 MED ORDER — SODIUM CHLORIDE 0.9% FLUSH
3.0000 mL | INTRAVENOUS | Status: DC | PRN
  Administered 2018-06-29: 3 mL via INTRAVENOUS
  Filled 2018-06-29: qty 3

## 2018-06-29 MED ORDER — ACETAMINOPHEN 500 MG PO TABS
1000.0000 mg | ORAL_TABLET | Freq: Once | ORAL | Status: AC
  Administered 2018-06-29: 1000 mg via ORAL
  Filled 2018-06-29: qty 2

## 2018-06-29 MED ORDER — FENTANYL CITRATE (PF) 250 MCG/5ML IJ SOLN
INTRAMUSCULAR | Status: DC | PRN
  Administered 2018-06-29 (×4): 50 ug via INTRAVENOUS
  Administered 2018-06-29: 150 ug via INTRAVENOUS
  Administered 2018-06-29: 50 ug via INTRAVENOUS
  Administered 2018-06-29: 100 ug via INTRAVENOUS

## 2018-06-29 MED ORDER — PROPOFOL 10 MG/ML IV BOLUS
INTRAVENOUS | Status: DC | PRN
  Administered 2018-06-29: 200 mg via INTRAVENOUS

## 2018-06-29 MED ORDER — THROMBIN 20000 UNITS EX SOLR
CUTANEOUS | Status: DC | PRN
  Administered 2018-06-29: 10:00:00 20 mL

## 2018-06-29 MED ORDER — GLYCOPYRROLATE PF 0.2 MG/ML IJ SOSY
PREFILLED_SYRINGE | INTRAMUSCULAR | Status: AC
  Filled 2018-06-29: qty 1

## 2018-06-29 MED ORDER — LIDOCAINE 2% (20 MG/ML) 5 ML SYRINGE
INTRAMUSCULAR | Status: DC | PRN
  Administered 2018-06-29: 60 mg via INTRAVENOUS

## 2018-06-29 MED ORDER — SODIUM CHLORIDE 0.9% FLUSH
3.0000 mL | Freq: Two times a day (BID) | INTRAVENOUS | Status: DC
  Administered 2018-06-29: 3 mL via INTRAVENOUS

## 2018-06-29 MED ORDER — HYDROMORPHONE HCL 1 MG/ML IJ SOLN
INTRAMUSCULAR | Status: AC
  Filled 2018-06-29: qty 1

## 2018-06-29 MED ORDER — HEMOSTATIC AGENTS (NO CHARGE) OPTIME
TOPICAL | Status: DC | PRN
  Administered 2018-06-29: 1

## 2018-06-29 MED ORDER — ALUM & MAG HYDROXIDE-SIMETH 200-200-20 MG/5ML PO SUSP: 30.0000 mL | Freq: Four times a day (QID) | ORAL | Status: DC | PRN

## 2018-06-29 MED ORDER — DEXAMETHASONE SODIUM PHOSPHATE 10 MG/ML IJ SOLN
INTRAMUSCULAR | Status: DC | PRN
  Administered 2018-06-29: 4 mg via INTRAVENOUS

## 2018-06-29 MED ORDER — ALBUMIN HUMAN 5 % IV SOLN
INTRAVENOUS | Status: DC | PRN
  Administered 2018-06-29 (×2): via INTRAVENOUS

## 2018-06-29 MED ORDER — LIDOCAINE 2% (20 MG/ML) 5 ML SYRINGE
INTRAMUSCULAR | Status: AC
  Filled 2018-06-29: qty 5

## 2018-06-29 MED ORDER — PHENYLEPHRINE HCL 10 MG/ML IJ SOLN
INTRAMUSCULAR | Status: DC | PRN
  Administered 2018-06-29: 120 ug via INTRAVENOUS
  Administered 2018-06-29 (×2): 80 ug via INTRAVENOUS
  Administered 2018-06-29: 120 ug via INTRAVENOUS

## 2018-06-29 SURGICAL SUPPLY — 77 items
BENZOIN TINCTURE PRP APPL 2/3 (GAUZE/BANDAGES/DRESSINGS) ×2 IMPLANT
BUR PRECISION FLUTE 5.0 (BURR) ×2 IMPLANT
CABLE BIPOLOR RESECTION CORD (MISCELLANEOUS) ×2 IMPLANT
CANISTER SUCT 3000ML PPV (MISCELLANEOUS) ×2 IMPLANT
CARTRIDGE OIL MAESTRO DRILL (MISCELLANEOUS) ×1 IMPLANT
CONT SPEC 4OZ CLIKSEAL STRL BL (MISCELLANEOUS) ×2 IMPLANT
COVER SURGICAL LIGHT HANDLE (MISCELLANEOUS) ×2 IMPLANT
COVER WAND RF STERILE (DRAPES) ×2 IMPLANT
DECANTER SPIKE VIAL GLASS SM (MISCELLANEOUS) ×2 IMPLANT
DIFFUSER DRILL AIR PNEUMATIC (MISCELLANEOUS) ×2 IMPLANT
DRAIN CHANNEL 15F RND FF W/TCR (WOUND CARE) ×2 IMPLANT
DRAPE POUCH INSTRU U-SHP 10X18 (DRAPES) ×4 IMPLANT
DRAPE SURG 17X23 STRL (DRAPES) ×8 IMPLANT
DURAPREP 26ML APPLICATOR (WOUND CARE) ×2 IMPLANT
ELECT BLADE 4.0 EZ CLEAN MEGAD (MISCELLANEOUS) ×2
ELECT CAUTERY BLADE 6.4 (BLADE) ×2 IMPLANT
ELECT REM PT RETURN 9FT ADLT (ELECTROSURGICAL) ×2
ELECTRODE BLDE 4.0 EZ CLN MEGD (MISCELLANEOUS) ×1 IMPLANT
ELECTRODE REM PT RTRN 9FT ADLT (ELECTROSURGICAL) ×1 IMPLANT
EVACUATOR SILICONE 100CC (DRAIN) ×2 IMPLANT
FILTER STRAW FLUID ASPIR (MISCELLANEOUS) ×2 IMPLANT
GAUZE 4X4 16PLY RFD (DISPOSABLE) ×4 IMPLANT
GAUZE SPONGE 4X4 12PLY STRL (GAUZE/BANDAGES/DRESSINGS) ×2 IMPLANT
GLOVE BIO SURGEON STRL SZ 6 (GLOVE) ×2 IMPLANT
GLOVE BIO SURGEON STRL SZ7 (GLOVE) ×2 IMPLANT
GLOVE BIO SURGEON STRL SZ8 (GLOVE) ×2 IMPLANT
GLOVE BIOGEL PI IND STRL 6 (GLOVE) ×1 IMPLANT
GLOVE BIOGEL PI IND STRL 7.0 (GLOVE) ×1 IMPLANT
GLOVE BIOGEL PI IND STRL 8 (GLOVE) ×1 IMPLANT
GLOVE BIOGEL PI INDICATOR 6 (GLOVE) ×1
GLOVE BIOGEL PI INDICATOR 7.0 (GLOVE) ×1
GLOVE BIOGEL PI INDICATOR 8 (GLOVE) ×1
GOWN STRL REUS W/ TWL LRG LVL3 (GOWN DISPOSABLE) ×2 IMPLANT
GOWN STRL REUS W/ TWL XL LVL3 (GOWN DISPOSABLE) ×2 IMPLANT
GOWN STRL REUS W/TWL LRG LVL3 (GOWN DISPOSABLE) ×2
GOWN STRL REUS W/TWL XL LVL3 (GOWN DISPOSABLE) ×2
IV CATH 14GX2 1/4 (CATHETERS) ×2 IMPLANT
KIT BASIN OR (CUSTOM PROCEDURE TRAY) ×2 IMPLANT
KIT POSITION SURG JACKSON T1 (MISCELLANEOUS) ×2 IMPLANT
KIT TURNOVER KIT B (KITS) ×2 IMPLANT
NEEDLE 18GX1X1/2 (RX/OR ONLY) (NEEDLE) ×2 IMPLANT
NEEDLE 22X1 1/2 (OR ONLY) (NEEDLE) ×2 IMPLANT
NEEDLE HYPO 25GX1X1/2 BEV (NEEDLE) ×2 IMPLANT
NEEDLE SPNL 18GX3.5 QUINCKE PK (NEEDLE) ×4 IMPLANT
NS IRRIG 1000ML POUR BTL (IV SOLUTION) ×4 IMPLANT
OIL CARTRIDGE MAESTRO DRILL (MISCELLANEOUS) ×2
PACK LAMINECTOMY ORTHO (CUSTOM PROCEDURE TRAY) ×2 IMPLANT
PACK UNIVERSAL I (CUSTOM PROCEDURE TRAY) ×2 IMPLANT
PAD ARMBOARD 7.5X6 YLW CONV (MISCELLANEOUS) ×4 IMPLANT
PATTIES SURGICAL .5 X.5 (GAUZE/BANDAGES/DRESSINGS) IMPLANT
PATTIES SURGICAL .5 X1 (DISPOSABLE) ×2 IMPLANT
SPONGE INTESTINAL PEANUT (DISPOSABLE) ×4 IMPLANT
SPONGE LAP 4X18 RFD (DISPOSABLE) ×8 IMPLANT
SPONGE SURGIFOAM ABS GEL 100 (HEMOSTASIS) ×2 IMPLANT
STRIP CLOSURE SKIN 1/2X4 (GAUZE/BANDAGES/DRESSINGS) ×2 IMPLANT
SURGIFLO W/THROMBIN 8M KIT (HEMOSTASIS) ×2 IMPLANT
SUT BONE WAX W31G (SUTURE) ×6 IMPLANT
SUT ETHILON 2 0 FS 18 (SUTURE) ×2 IMPLANT
SUT MNCRL AB 4-0 PS2 18 (SUTURE) ×2 IMPLANT
SUT VIC AB 0 CT1 18XCR BRD 8 (SUTURE) IMPLANT
SUT VIC AB 0 CT1 27 (SUTURE)
SUT VIC AB 0 CT1 27XBRD ANBCTR (SUTURE) IMPLANT
SUT VIC AB 0 CT1 8-18 (SUTURE)
SUT VIC AB 1 CT1 18XCR BRD 8 (SUTURE) ×1 IMPLANT
SUT VIC AB 1 CT1 8-18 (SUTURE) ×1
SUT VIC AB 2-0 CT2 18 VCP726D (SUTURE) ×2 IMPLANT
SYR 20CC LL (SYRINGE) ×2 IMPLANT
SYR BULB IRRIGATION 50ML (SYRINGE) ×2 IMPLANT
SYR CONTROL 10ML LL (SYRINGE) ×4 IMPLANT
SYR TB 1ML 26GX3/8 SAFETY (SYRINGE) ×4 IMPLANT
SYR TB 1ML LUER SLIP (SYRINGE) ×4 IMPLANT
TAPE CLOTH SURG 6X10 WHT LF (GAUZE/BANDAGES/DRESSINGS) ×2 IMPLANT
TOWEL GREEN STERILE (TOWEL DISPOSABLE) ×2 IMPLANT
TOWEL GREEN STERILE FF (TOWEL DISPOSABLE) ×2 IMPLANT
TRAY FOLEY MTR SLVR 16FR STAT (SET/KITS/TRAYS/PACK) ×2 IMPLANT
WATER STERILE IRR 1000ML POUR (IV SOLUTION) ×2 IMPLANT
YANKAUER SUCT BULB TIP NO VENT (SUCTIONS) ×2 IMPLANT

## 2018-06-29 NOTE — Anesthesia Preprocedure Evaluation (Addendum)
Anesthesia Evaluation  Patient identified by MRN, date of birth, ID band Patient awake    Reviewed: Allergy & Precautions, NPO status , Patient's Chart, lab work & pertinent test results  Airway Mallampati: III  TM Distance: >3 FB Neck ROM: Full    Dental no notable dental hx.    Pulmonary former smoker,    Pulmonary exam normal        Cardiovascular negative cardio ROS Normal cardiovascular exam     Neuro/Psych negative neurological ROS  negative psych ROS   GI/Hepatic negative GI ROS, Neg liver ROS,   Endo/Other  negative endocrine ROS  Renal/GU negative Renal ROS     Musculoskeletal Lumbosacral spinal stenosis   Abdominal   Peds  Hematology negative hematology ROS (+)   Anesthesia Other Findings Lumbar 2-5 1 Decompression  Reproductive/Obstetrics                            Anesthesia Physical Anesthesia Plan  ASA: II  Anesthesia Plan: General   Post-op Pain Management:    Induction: Intravenous  PONV Risk Score and Plan: 3 and Midazolam, Dexamethasone, Ondansetron and Treatment may vary due to age or medical condition  Airway Management Planned: Oral ETT  Additional Equipment:   Intra-op Plan:   Post-operative Plan: Extubation in OR  Informed Consent: I have reviewed the patients History and Physical, chart, labs and discussed the procedure including the risks, benefits and alternatives for the proposed anesthesia with the patient or authorized representative who has indicated his/her understanding and acceptance.     Dental advisory given  Plan Discussed with: CRNA  Anesthesia Plan Comments:        Anesthesia Quick Evaluation

## 2018-06-29 NOTE — Anesthesia Procedure Notes (Addendum)
Procedure Name: Intubation Date/Time: 06/29/2018 8:32 AM Performed by: Leonor Liv, CRNA Pre-anesthesia Checklist: Patient identified, Emergency Drugs available, Suction available and Patient being monitored Patient Re-evaluated:Patient Re-evaluated prior to induction Oxygen Delivery Method: Circle System Utilized Preoxygenation: Pre-oxygenation with 100% oxygen Induction Type: IV induction Ventilation: Mask ventilation without difficulty Laryngoscope Size: Mac and 4 Grade View: Grade I Tube type: Oral Tube size: 7.5 mm Number of attempts: 1 Airway Equipment and Method: Stylet and Oral airway Placement Confirmation: ETT inserted through vocal cords under direct vision,  positive ETCO2 and breath sounds checked- equal and bilateral Secured at: 23 cm Tube secured with: Tape Dental Injury: Teeth and Oropharynx as per pre-operative assessment

## 2018-06-29 NOTE — Op Note (Signed)
PATIENT NAME: Todd PoundsMichael May   MEDICAL RECORD NO.:   [id number]    PHYSICIAN:  Estill BambergMark Tearra Ouk, MD      DATE OF BIRTH: 03/27/1955  DATE OF PROCEDURE: 06/29/2018        OPERATIVE REPORT   PREOPERATIVE DIAGNOSES: 1. Progressive bilateral leg weakness 2. Severe spinal stenosis L2/3, L3/4, L4/5.  POSTOPERATIVE DIAGNOSES: 1. Progressive bilateral leg weakness 2. Severe spinal stenosis L2/3, L3/4, L4/5.  PROCEDURE:  L2/3, L3/4, L4/5 laminectomy with bilateral partial facetectomy and bilateral foraminotomy.  SURGEON:  Estill BambergMark Samyiah Halvorsen, MD.  ASSISTANJason Coop:  Kayla McKenzie, PA-C.  ANESTHESIA:  General endotracheal anesthesia.  COMPLICATIONS:  None.  DISPOSITION:  Stable.  ESTIMATED BLOOD LOSS:  400cc  INDICATIONS FOR SURGERY:  Briefly, Mr. Todd HockeyRobinson is a very pleasant 63 y.o.  year-old male, who did present to me with progressive weakness in the bilateral legs. The patient's MRI did reveal severe spinal stenosis at L2/3, L3/4 and L4/5.  This was extremely concerning to him, and he was worked up extensively for this. It was ultimately felt to be due to his severe stenosis. I did gain input from a neurologist, who did feel that his symptoms were due to the severe stenosis noted on his MRI.  Given the patient's progressive dysfunction, we did discuss proceeding with the procedure reflected above.  The patient was fully aware of the risks and limitations of surgery and did wish to proceed.  OPERATIVE DETAILS:  On 06/29/2018, the patient was brought to surgery and general endotracheal anesthesia was administered.  The patient was placed prone on a well-padded flat Jackson bed with a spinal frame. Antibiotics were given and the back was prepped and draped in the usual sterile fashion.  A time-out procedure was performed.  I then made a midline incision overlying the L2/3 L3/4 and L4/5 intervertebral spaces.  The fascia was incised at the midline.  The paraspinal musculature was bluntly retracted  laterally and held retracted with a self-retaining retractor. After confirming the appropriate operative level, I did remove the spinous process of L2, L3 and L4.  At this point, I proceeded with a partial facetectomy on the right and left sides.  Of note, there was  significant overgrowth of the facet joints bilaterally, and there was also substantial hypertrophy of the ligamentum flavum.  The lateral recess stenosis was addressed by using Kerrison punches to thoroughly and decompress the right and left lateral recess.  At this point, I carried the decompression up to the L3-4 level.  Once again, a partial facetectomy was performed bilaterally, and I was able to thoroughly and completely decompress the right and left lateral recess at the L3-4 level.  I then carried the decompression up to the L2/3 level.  Once again, a partial facetectomy was performed bilaterally, and I was able to thoroughly and completely decompress the right and left lateral recess at L2/3. I then performed foraminotomies bilaterally at L2/3, L3/4, and then L4/5.  At the termination of the decompression, I was able to easily pass a [facility] elevator out the neuroforamena on the right and left sides at L2/3, L3/4 and L4/5.  The spinal canal was entirely decompressed.  All bleeding was then adequately controlled.  At this point, 40 mg of Depo-Medrol was introduced about the epidural space.  Prior to this, the wound was copiously irrigated with a total of approximately 2 L of normal saline. Gelfoam was placed over the laminectomy site.  I was very pleased with the decompression.  There was no  extravasation of cerebrospinal fluid noted throughout the entire surgery.  At this point, the wound was closed in layers using #1 Vicryl, followed by 2-0 Vicryl, followed by 4- 0 Monocryl.  Benzoin and Steri-Strips were applied, followed by a sterile dressing.  All instrument counts were correct at the termination of the procedure. A #15  deep blake drain was placed prior to closure.  Of note, Jason Coop, PA-C, was my assistant throughout surgery, and did aid in retraction, suctioning, and closure from start to finish.     Estill Bamberg, MD

## 2018-06-29 NOTE — H&P (Signed)
PREOPERATIVE H&P  Chief Complaint: Progressive bilateral leg weakness  HPI: Todd May is a 63 y.o. male who presents with progressive weakness in the bilateral legs, which as progressed rapidly over the last 2-3 months.   MRI reveals severe spinal stenosis spanning L2-L5. Patient was urgently worked with a neurology referral, and his neurologist did feel that his symptoms were secondary to his severe spinal stenosis, and a decompressive procedure was recommended. Patient feels that his function has deteriorated very rapidly, and he is very concerned about this.   Patient has failed multiple forms of conservative care and continues to have pain (see office notes for additional details regarding the patient's full course of treatment)  Past Medical History:  Diagnosis Date  . Arthritis   . History of kidney stones   . Lumbosacral spinal stenosis 06/14/2018   L2-3, L3-4 levels   Past Surgical History:  Procedure Laterality Date  . arthroscopic knee surgery left    . JOINT REPLACEMENT    . TONSILLECTOMY     Social History   Socioeconomic History  . Marital status: Single    Spouse name: Not on file  . Number of children: Not on file  . Years of education: Not on file  . Highest education level: Not on file  Occupational History  . Not on file  Social Needs  . Financial resource strain: Not on file  . Food insecurity:    Worry: Not on file    Inability: Not on file  . Transportation needs:    Medical: Not on file    Non-medical: Not on file  Tobacco Use  . Smoking status: Former Games developermoker  . Smokeless tobacco: Never Used  . Tobacco comment: quit about 30 years ago  Substance and Sexual Activity  . Alcohol use: Not Currently    Frequency: Never  . Drug use: Not on file  . Sexual activity: Not on file  Lifestyle  . Physical activity:    Days per week: Not on file    Minutes per session: Not on file  . Stress: Not on file  Relationships  . Social  connections:    Talks on phone: Not on file    Gets together: Not on file    Attends religious service: Not on file    Active member of club or organization: Not on file    Attends meetings of clubs or organizations: Not on file    Relationship status: Not on file  Other Topics Concern  . Not on file  Social History Narrative  . Not on file   Family History  Problem Relation Age of Onset  . Cancer - Colon Father   . Obesity Sister   . Heart disease Sister    No Known Allergies Prior to Admission medications   Medication Sig Start Date End Date Taking? Authorizing Provider  acetaminophen-codeine (TYLENOL #3) 300-30 MG tablet Take 1 tablet by mouth 4 (four) times daily as needed (pain.).  06/01/18  Yes [provider]  amitriptyline (ELAVIL) 25 MG tablet Take 25 mg by mouth at bedtime.  02/07/18  Yes [provider]  aspirin EC 81 MG tablet Take 81 mg by mouth daily.   Yes [provider]  cyclobenzaprine (FLEXERIL) 5 MG tablet Take 5 mg by mouth daily as needed (spasms).  05/12/18  Yes [provider]  diclofenac (VOLTAREN) 75 MG EC tablet Take 75 mg by mouth daily.  02/07/18  Yes [provider]  meloxicam (MOBIC) 15 MG tablet Take 15 mg by mouth daily. 05/23/18  Yes [provider]  methocarbamol (ROBAXIN) 500 MG tablet Take 500 mg by mouth 3 (three) times daily. Morning, lunch, afternoon 05/25/18  Yes [provider]  Multiple Vitamin (MULTIVITAMIN WITH MINERALS) TABS tablet Take 1 tablet by mouth daily. [employer] MEGA MEN   Yes [provider]     All other systems have been reviewed and were otherwise negative with the exception of those mentioned in the HPI and as above.  Physical Exam: Vitals:   06/29/18 0603 06/29/18 0634  BP:  131/84  Pulse:  75  Resp:  18  Temp: 98.6 F (37 C)   SpO2:  98%    Body mass index is 28.66 kg/m.  General: Alert, no acute distress Cardiovascular: No pedal edema  Respiratory: No cyanosis, no use of accessory musculature Skin: No lesions in the area of chief complaint Neurologic: Trace weakness to EHL bilterally Psychiatric: Patient is competent for consent with normal mood and affect Lymphatic: No axillary or cervical lymphadenopathy   Assessment/Plan: Severe spinal stenosis lumbar 2 - 5 Plan for Procedure(s): LUMBAR 2-5 LAMINECTOMY   Jackelyn Hoehn, MD 06/29/2018 7:52 AM

## 2018-06-29 NOTE — Anesthesia Postprocedure Evaluation (Signed)
Anesthesia Post Note  Patient: Ashraf Merrihew  Procedure(s) Performed: LUMBAR 2-5 LAMINECTOMY/DECOMPRESSION MICRODISCECTOMY (N/A Spine Lumbar)     Patient location during evaluation: PACU Anesthesia Type: General Level of consciousness: awake and alert Pain management: pain level controlled Vital Signs Assessment: post-procedure vital signs reviewed and stable Respiratory status: spontaneous breathing, nonlabored ventilation, respiratory function stable and patient connected to nasal cannula oxygen Cardiovascular status: blood pressure returned to baseline and stable Postop Assessment: no apparent nausea or vomiting Anesthetic complications: no    Last Vitals:  Vitals:   06/29/18 1514 06/29/18 1515  BP:  (!) 144/85  Pulse: (!) 105 (!) 101  Resp: (!) 22 20  Temp:  (!) 36.1 C  SpO2: 97% 97%    Last Pain:  Vitals:   06/29/18 1515  PainSc: 2                  Ryan P Ellender

## 2018-06-29 NOTE — Transfer of Care (Signed)
Immediate Anesthesia Transfer of Care Note  Patient: Todd May  Procedure(s) Performed: LUMBAR 2-5 LAMINECTOMY/DECOMPRESSION MICRODISCECTOMY (N/A Spine Lumbar)  Patient Location: PACU  Anesthesia Type:General  Level of Consciousness: sedated  Airway & Oxygen Therapy: Patient Spontanous Breathing and Patient connected to face mask oxygen  Post-op Assessment: Report given to RN, Post -op Vital signs reviewed and stable and Patient moving all extremities  Post vital signs: Reviewed and stable  Last Vitals:  Vitals Value Taken Time  BP 107/69 06/29/2018  2:15 PM  Temp    Pulse 101 06/29/2018  2:18 PM  Resp 54 06/29/2018  2:18 PM  SpO2 100 % 06/29/2018  2:18 PM  Vitals shown include unvalidated device data.  Last Pain:  Vitals:   06/29/18 0644  PainSc: 6       Patients Stated Pain Goal: 2 (06/29/18 3810)  Complications: No apparent anesthesia complications Skin tear noted to R cheek from OETT tape-PACU RN notified. Nasal trumpet removed in PACU without issue. Maintaining airway, following commands.

## 2018-06-30 DIAGNOSIS — M48061 Spinal stenosis, lumbar region without neurogenic claudication: Secondary | ICD-10-CM | POA: Diagnosis not present

## 2018-06-30 MED ORDER — PANTOPRAZOLE SODIUM 40 MG PO TBEC: 40.0000 mg | DELAYED_RELEASE_TABLET | Freq: Every day | ORAL | Status: DC

## 2018-06-30 MED FILL — Thrombin (Recombinant) For Soln 20000 Unit: CUTANEOUS | Qty: 1 | Status: AC

## 2018-06-30 NOTE — Progress Notes (Signed)
    Patient doing well  No leg pain Minimal low back pain He does report feeling improvements in his bilateral leg tingling and numbeness   Physical Exam: Vitals:   06/29/18 2354 06/30/18 0430  BP: 110/72 126/76  Pulse: 81 72  Resp:    Temp: 98.6 F (37 C) 98.2 F (36.8 C)  SpO2: 97% 98%    Dressing in place NVI  Drain output: 20cc since midnight (over 7 hours)  POD #1 s/p L2-L5 decompression  - encourage ambulation - Percocet for pain, Valium for muscle spasms - likely d/c home today with f/u in 2 weeks - nursing to d/c drain prior to discharge

## 2018-06-30 NOTE — Progress Notes (Signed)
Discussed IT sales professional and went over instructions. Therapist, music website for general instructions and diagrams of brace. Patient states MD went over it with him as well. Instructed to call office to clarify any questions regarding dressing or follow up appointments not discussed by MD or listed in AVS. To call MD if develops any s/s of infection such as fever, redness and swelling or draining at surgical wound site. Any new numbness, tingling or not feeling well. Report any fevers to surgeon's office or family MD. Patient eager and verbally understood discussion. Gabriel Cirri RN

## 2018-06-30 NOTE — Discharge Instructions (Signed)
Laminectomy, Care After This sheet gives you information about how to care for yourself after your procedure. Your health care provider may also give you more specific instructions. If you have problems or questions, contact your health care provider. What can I expect after the procedure? After the procedure, it is common to have:  Some pain around your incision area.  Muscle tightening (spasms) across the back. Follow these instructions at home: Incision care   Follow instructions from your health care provider about how to take care of your incision area. Make sure you: ? Wash your hands with soap and water before and after you apply medicine to the area or change your bandage (dressing). If soap and water are not available, use hand sanitizer. ? Change your dressing as told by your health care provider. ? Leave stitches (sutures), skin glue, or adhesive strips in place. These skin closures may need to stay in place for 2 weeks or longer. If adhesive strip edges start to loosen and curl up, you may trim the loose edges. Do not remove adhesive strips completely unless your health care provider tells you to do that.  Check your incision area every day for signs of infection. Check for: ? More redness, swelling, or pain. ? More fluid or blood. ? Warmth. ? Pus or a bad smell. Medicines  Take over-the-counter and prescription medicines only as told by your health care provider.  If you were prescribed an antibiotic medicine, use it as told by your health care provider. Do not stop using the antibiotic even if you start to feel better. Bathing  Do not take baths, swim, or use a hot tub for 2 weeks, or until your incision has healed completely.  If your health care provider approves, you may take showers after your dressing has been removed. Activity   Return to your normal activities as told by your health care provider. Ask your health care provider what activities are safe for  you.  Avoid bending or twisting at your waist. Always bend at your knees.  Do not sit for more than 20-30 minutes at a time. Lie down or walk between periods of sitting.  Do not lift anything that is heavier than 10 lb (4.5 kg) or the limit that your health care provider tells you, until he or she says that it is safe.  Do not drive for 2 weeks after your procedure or for as long as your health care provider tells you.  Do not drive or use heavy machinery while taking prescription pain medicine. General instructions  To prevent or treat constipation while you are taking prescription pain medicine, your health care provider may recommend that you: ? Drink enough fluid to keep your urine clear or pale yellow. ? Take over-the-counter or prescription medicines. ? Eat foods that are high in fiber, such as fresh fruits and vegetables, whole grains, and beans. ? Limit foods that are high in fat and processed sugars, such as fried and sweet foods.  Do breathing exercises as told.  Keep all follow-up visits as told by your health care provider. This is important. Contact a health care provider if:  You have more redness, swelling, or pain around your incision area.  Your incision feels warm to the touch.  You are not able to return to activities or do exercises as told by your health care provider. Get help right away if:  You have: ? More fluid or blood coming from your incision area. ? Pus  or a bad smell coming from your incision area. ? Chills or a fever. ? Episodes of dizziness or fainting while standing.  You develop a rash.  You develop shortness of breath or you have difficulty breathing.  You cannot control when you urinate or have a bowel movement.  You become weak.  You are not able to use your legs. Summary  After the procedure, it is common to have some pain around your incision area. You may also have muscle tightening (spasms) across the back.  Follow  instructions from your health care provider about how to care for your incision.  Do not lift anything that is heavier than 10 lb (4.5 kg) or the limit that your health care provider tells you, until he or she says that it is safe.  Contact your health care provider if you have more redness, swelling, or pain around your incision area or if your incision feels warm to the touch. These can be signs of infection. This information is not intended to replace advice given to you by your health care provider. Make sure you discuss any questions you have with your health care provider. Document Released: 09/26/2004 Document Revised: 10/24/2015 Document Reviewed: 08/25/2015 Elsevier Interactive Patient Education  2019 [employer]  How to Use a Back Brace  A back brace is a form-fitting device that wraps around your trunk to support your lower back, abdomen, and hips. You may need to wear a back brace to relieve back pain or to correct a medical condition related to the back, such as abnormal curvature of the spine (scoliosis). A back brace can maintain or correct the shape of the spine and prevent a spinal problem from getting worse. A back brace can also take pressure off the layers of tissue (disks) between the bones of the spine (vertebrae). You may need a back brace to keep your back and spine in place while you heal from an injury or recover from surgery. Back braces can be either plastic (rigid brace) or soft elastic (dynamic brace). A rigid brace usually covers both the front and back of the entire upper body. A soft brace may cover only the lower back and abdomen and may fasten with self-adhesive elastic straps. Your health care provider will recommend the proper brace for your needs and medical condition. What are the risks?  A back brace may not help if you do not wear it as directed by your health care provider. Be sure to wear the brace exactly as instructed in order to prevent further back  problems.  Wearing the brace may be uncomfortable at first. You may have trouble sleeping with it on. It may also be hard for you to do certain activities while wearing the brace. How to use a back brace Different types of braces will have different instructions for use. Follow instructions from your health care provider about:  How to put on the brace.  When and how often to wear the brace. In some cases, braces may need to be worn for long stretches of time. For example, a brace may need to be worn for 16-23 hours a day when used for scoliosis.  How to take off the brace.  Any safety tips you should follow when wearing the brace. This may include: ? Moving carefully while wearing the brace. The brace restricts your movement and could lead to additional injuries. ? Using a cane or walker for support if you feel unsteady. ? Sitting in high, firm  chairs. It may be difficult to stand up from low, soft chairs. How to care for a back brace  Do not let the back brace get wet. Typically, you will remove the brace for bathing and then put it back on afterward.  If you have a rigid brace, be sure to store it in a safe place when you are not wearing it. This will help to prevent damage.  Clean or wash the back brace with mild soap and water as told by your health care provider. Contact a health care provider if:  Your brace gets damaged.  You have pain or discomfort when wearing the back brace.  Your back pain is getting worse or is not improving over time. This information is not intended to replace advice given to you by your health care provider. Make sure you discuss any questions you have with your health care provider. Document Released: 02/26/2011 Document Revised: 04/04/2015 Document Reviewed: 10/31/2014 Elsevier Interactive Patient Education  2019 [employer].

## 2018-07-01 ENCOUNTER — Encounter (HOSPITAL_COMMUNITY): Payer: Self-pay | Admitting: Orthopedic Surgery

## 2018-07-13 ENCOUNTER — Encounter: Payer: Commercial Managed Care - PPO | Admitting: Neurology

## 2018-10-13 ENCOUNTER — Other Ambulatory Visit: Payer: Self-pay | Admitting: Orthopaedic Surgery

## 2018-10-13 DIAGNOSIS — M4802 Spinal stenosis, cervical region: Secondary | ICD-10-CM

## 2018-10-13 DIAGNOSIS — M546 Pain in thoracic spine: Secondary | ICD-10-CM

## 2018-10-13 DIAGNOSIS — M48061 Spinal stenosis, lumbar region without neurogenic claudication: Secondary | ICD-10-CM

## 2018-10-17 ENCOUNTER — Telehealth: Payer: Self-pay | Admitting: Nurse Practitioner

## 2018-10-17 NOTE — Telephone Encounter (Signed)
Phone call to patient to verify medication list and allergies for myelogram procedure. Pt instructed to hold Elavil for 48hrs prior to myelogram appointment time. Pt verbalized understanding. Pre and post procedure instructions reviewed with pt. 

## 2018-10-27 NOTE — Discharge Instructions (Signed)

## 2018-10-28 ENCOUNTER — Ambulatory Visit
Admission: RE | Admit: 2018-10-28 | Discharge: 2018-10-28 | Disposition: A | Discharge status: Home / Self Care | Payer: Commercial Managed Care - PPO | Source: Ambulatory Visit | Attending: Orthopaedic Surgery | Admitting: Orthopaedic Surgery

## 2018-10-28 ENCOUNTER — Ambulatory Visit
Admission: RE | Admit: 2018-10-28 | Discharge: 2018-10-28 | Disposition: A | Discharge status: Home / Self Care | Payer: Self-pay | Source: Ambulatory Visit | Attending: Orthopaedic Surgery | Admitting: Orthopaedic Surgery

## 2018-10-28 ENCOUNTER — Other Ambulatory Visit: Payer: Self-pay | Admitting: Orthopaedic Surgery

## 2018-10-28 ENCOUNTER — Other Ambulatory Visit: Payer: Self-pay

## 2018-10-28 DIAGNOSIS — M4802 Spinal stenosis, cervical region: Secondary | ICD-10-CM

## 2018-10-28 DIAGNOSIS — M48061 Spinal stenosis, lumbar region without neurogenic claudication: Secondary | ICD-10-CM

## 2018-10-28 DIAGNOSIS — M546 Pain in thoracic spine: Secondary | ICD-10-CM

## 2018-10-28 MED ORDER — IOPAMIDOL (ISOVUE-M 300) INJECTION 61%
10.0000 mL | Freq: Once | INTRAMUSCULAR | Status: AC
  Administered 2018-10-28: 10 mL via INTRATHECAL

## 2018-10-28 MED ORDER — DIAZEPAM 5 MG PO TABS
5.0000 mg | ORAL_TABLET | Freq: Once | ORAL | Status: AC
  Administered 2018-10-28: 5 mg via ORAL

## 2021-05-10 IMAGING — CT CT CERVICAL SPINE WITH CONTRAST
2 series · 10 of 14 positions shown, 12 images · non-contrast
Comparison: none

CLINICAL DATA: Bilateral foot drop. Extensive lower extremity pain
and unstable gait.
TECHNIQUE: Contiguous axial images were obtained through the Cervical,
Thoracic, and Lumbar spine after the intrathecal infusion of
infusion. Coronal and sagittal reconstructions were obtained of the
axial image sets.

[Series 3: cspine soft · axial · 0.33mm/px · z∈[-238,-122]mm · 5 of 88 slices shown]
[im 15/88  soft-tissue]
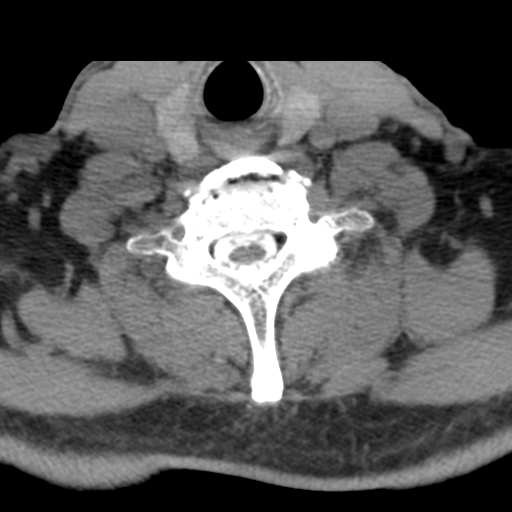
[im 30/88  soft-tissue]
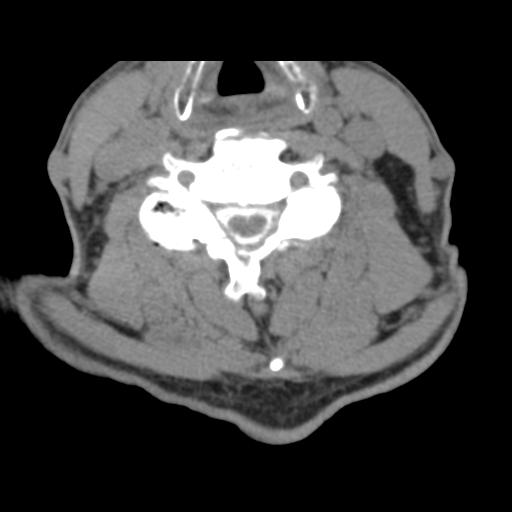
[im 44/88  soft-tissue]
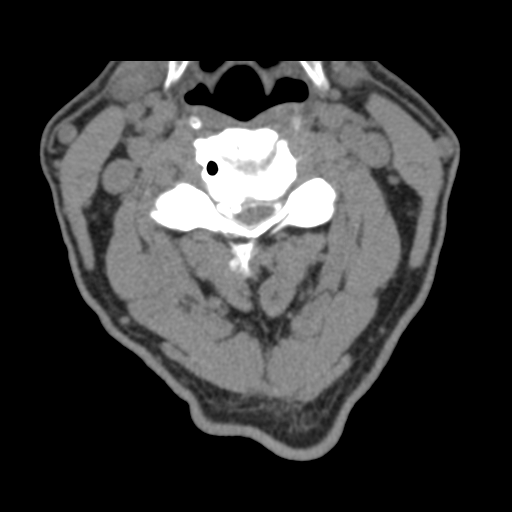
[im 59/88  soft-tissue]
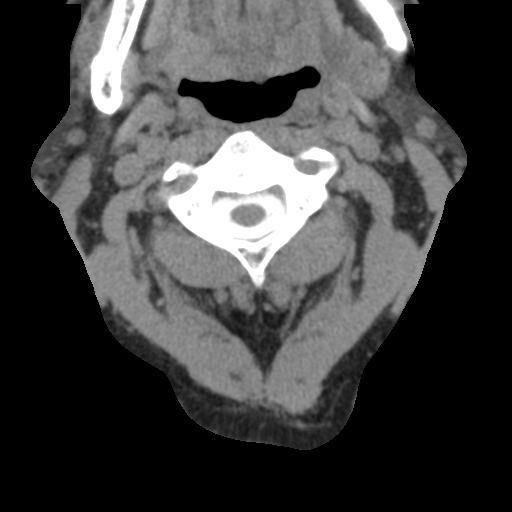
[im 73/88  soft-tissue]
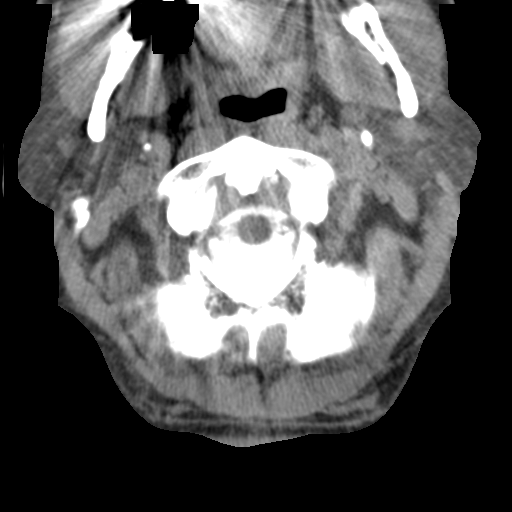

[Series 9: angled axial · axial · 0.29mm/px · z∈[-252,-136]mm · 5 of 89 slices shown, 7 images]
[im 15/89  soft-tissue]
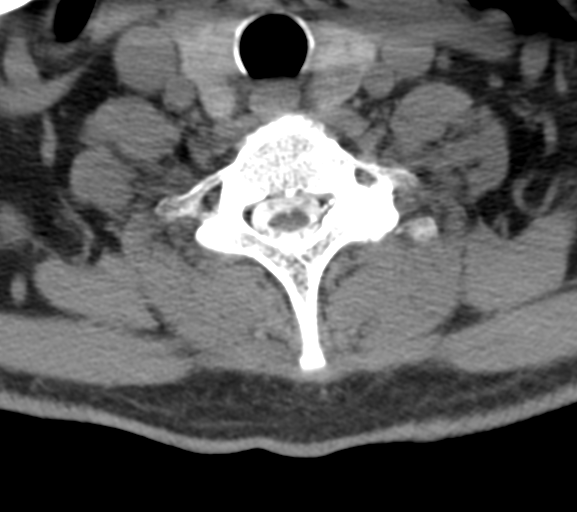
[im 15/89  bone]
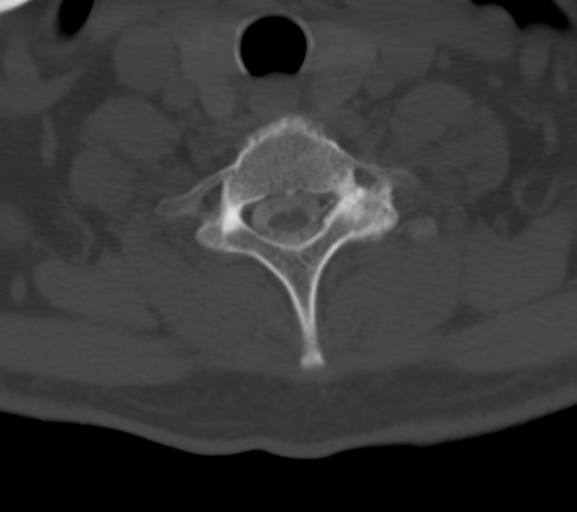
[im 30/89  bone]
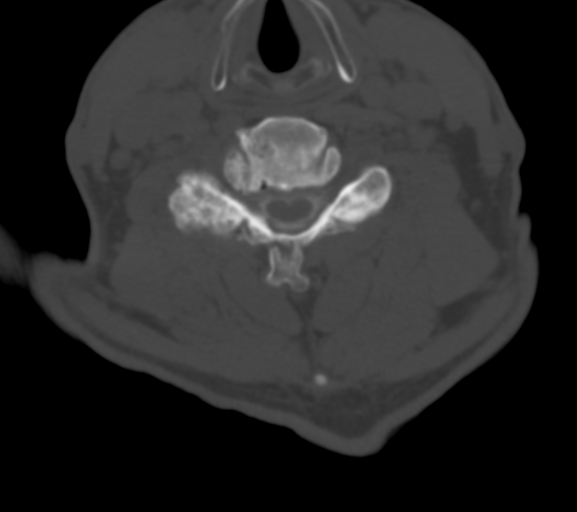
[im 45/89  bone]
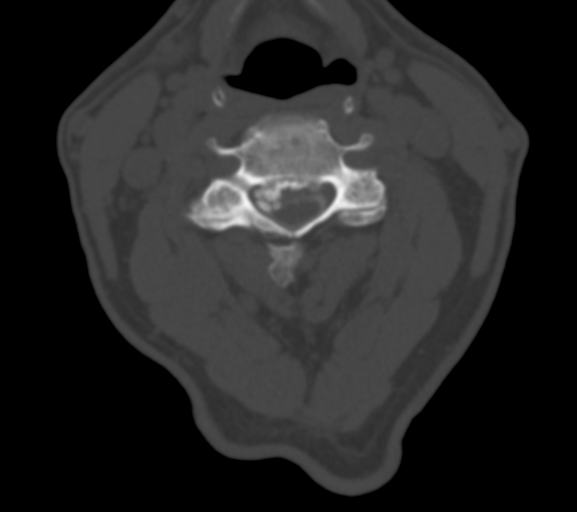
[im 59/89  bone]
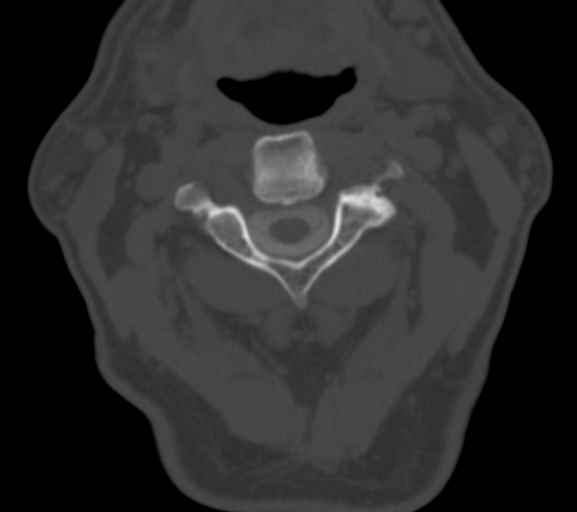
[im 74/89  soft-tissue]
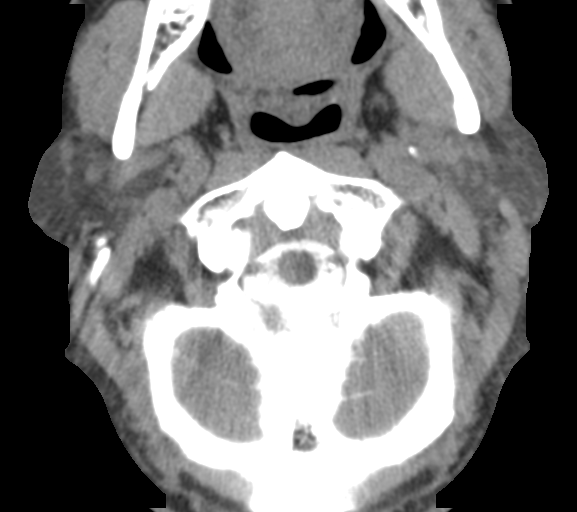
[im 74/89  bone]
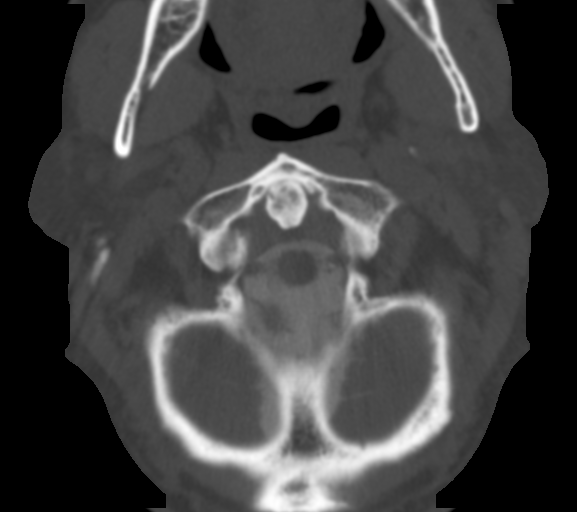

[10 of 14 positions shown; findings below may reference images not displayed]

FLUOROSCOPY TIME:  Radiation Exposure Index (as provided by the
fluoroscopic device): 993.79 uGy*m2

PROCEDURE:
LUMBAR PUNCTURE FOR CERVICAL LUMBAR AND THORACIC MYELOGRAM

CERVICAL AND LUMBAR AND THORACIC MYELOGRAM

CT CERVICAL MYELOGRAM

CT LUMBAR MYELOGRAM

CT THORACIC MYELOGRAM

After thorough discussion of risks and benefits of the procedure
including bleeding, infection, injury to nerves, blood vessels,
adjacent structures as well as headache and CSF leak, written and
oral informed consent was obtained. Consent was obtained by Dr.
Ellert Dolfing.

Patient was positioned prone on the fluoroscopy table. Local
anesthesia was provided with 1% lidocaine without epinephrine after
prepped and draped in the usual sterile fashion. Puncture was
performed at L5-S1 using a 3 1/2 inch 22-gauge spinal needle via
right paramedian approach. Using a single pass through the dura, the
needle was placed within the thecal sac, with return of clear CSF. 5
mL Isovue X-6OO was injected into the thecal sac, with normal
opacification of the nerve roots and cauda equina consistent with
free flow within the subarachnoid space.

There was a relative obstruction or early at the L4-5 level. I
therefore performed a second puncture at the L3 level through the
laminectomy. An additional 5 mL of Isovue M 300 was injected.

The patient was then moved to the trendelenburg position and
contrast flowed into the Thoracic and Cervical spine regions.

I personally performed the lumbar puncture and administered the
intrathecal contrast. I also personally supervised acquisition of
the myelogram images.
FINDINGS: MYELOGRAM FINDINGS:

Prominent disc osteophyte complex is evident at C4-5. There is
slight retrolisthesis at C3-4. Smaller disc osteophyte complex
present at C5-6. Facet changes are greatest at C5-6. Marked
uncovertebral spurring is present at C3-4 and C4-5.

Patient is status post laminectomy at L2, L3, and L4. There is
chronic loss of disc height at these levels. Residual central canal
stenosis is present at L2-3, L3-4, and L4-5. There is persistent
crowding of nerve roots at the L2-3 level. Right greater than left
subarticular narrowing is present L3-4. Moderate central canal
stenosis is present at L4-5.

Slight retrolisthesis at L2-3 and L4-5 is stable on standing. There
is no significant change in flexion or extension.

CT CERVICAL MYELOGRAM FINDINGS:

Slight retrolisthesis is present at C3-4. There is minimal
anterolisthesis at C5-6. Cervical lordosis is otherwise maintained.

C2-3: Asymmetric left-sided facet hypertrophy contributes to mild
left foraminal narrowing. There is a central disc osteophyte which
partially effaces the ventral CSF.

C3-4: A rightward disc osteophyte complex is present. There is
moderate central canal stenosis with marked distortion of the right
side of the cord. Severe right and moderate left foraminal narrowing
is present.

C4-5: A broad-based disc osteophyte complex is asymmetric to the
left. There is partial effacement ventral CSF without significant
distortion of the cord. Severe left and moderate right foraminal
narrowing is due to uncovertebral and facet disease.

C5-6: Asymmetric right-sided uncovertebral disease is present. There
is partial effacement of the ventral CSF. Asymmetric right-sided
facet hypertrophy is also present. This leads to moderate right and
mild left foraminal narrowing.

C6-7: A broad-based disc osteophyte complex present. There is
partial effacement ventral CSF. Moderate left and mild right
foraminal narrowing is due to uncovertebral disease.

C7-T1: Facet hypertrophy is worse on the left. Spurring contributes
to mild left foraminal narrowing.

CT LUMBAR MYELOGRAM FINDINGS:

Five lumbar type vertebral bodies are present. A small amount of
subdural contrast is present at the L4-5 and L5 level. Conus
medullaris terminates at L1. There is straightening of the normal
lumbar lordosis. No significant listhesis is present.

Limited imaging of the abdomen is unremarkable. No significant
adenopathy is present.

T12-L1: Mild facet hypertrophy is present. There is minimal disc
bulging. No significant stenosis is present.

L1-2: Broad-based disc protrusion is present. Moderate facet
hypertrophy and ligamentum flavum thickening is noted. Mild central
canal narrowing is present bilaterally. Mild foraminal narrowing is
present bilaterally.

L2-3: Wide laminectomy is noted. The posterior canal is
decompressed. There is chronic loss of disc height with residual
endplate osteophytes. Endplate osteophytes and facet spurring
contribute to moderate foraminal narrowing, right greater than left.

L3-4: Wide laminectomy decompresses the posterior canal. Residual
broad-based disc protrusion and facet hypertrophy resulting in no
residual subarticular narrowing bilaterally. Moderate right and mild
left foraminal stenosis is due to disc disease and facet spurring.

L4-5: Subdural contrast outlines a residual disc extrusion on the
left. This contributes to moderate left subarticular narrowing. The
posterior canal is decompressed. Moderate foraminal narrowing
bilaterally is worse left than right.

L5-S1: The central canal is patent. Mild facet hypertrophy is noted
bilaterally. A broad-based disc protrusion and endplate osteophytes
contribute to moderate foraminal stenosis bilaterally.

CT THORACIC MYELOGRAM FINDINGS:

Twelve rib-bearing thoracic type vertebral bodies are present.
Vertebral body heights are maintained. There is some straightening
of the normal thoracic lordosis. Endplate Schmorl's nodes are
present at T10 on 11 and T11-12. No focal lytic or sclerotic lesions
are present.

No significant central canal stenosis is present. No focal disc
disease evident.

Mild left foraminal narrowing at T1-2 is secondary to facet
spurring. Mild foraminal narrowing at T2-3 is worse on the right,
secondary to facet spurring. Facet spurring is present on the left
at T9-10 with resultant foraminal narrowing.
IMPRESSION: 1. Moderate central canal stenosis at C3-4 with significant
distortion of the right side of the spinal cord due to a rightward
disc osteophyte complex.
2. Severe right and moderate left foraminal narrowing at C3-4.
3. Mild central canal narrowing at C4-5 with severe left moderate
right foraminal stenosis.
4. Mild central canal narrowing with moderate right and mild left
foraminal stenosis at C5-6.
5. Mild central canal narrowing with moderate left mild right
narrowing at C6-7.
6. Mild left foraminal stenosis at C7-T1.
7. Mild left foraminal narrowing at T1-2.
8. Foraminal narrowing bilaterally at T2-3.
9. Left foraminal narrowing at T9-10.
10. Mild central and bilateral foraminal narrowing at L1-2.
11. Moderate foraminal narrowing remains at L2-3 secondary to
endplate osteophytes and facet spurring.
12. Residual moderate subarticular narrowing bilaterally at L3-4
secondary to a broad-based disc protrusion and bilateral facet
hypertrophy.
13. Foraminal narrowing at L3-4 is moderate on the right and mild on
left.
14. Moderate left subarticular and foraminal stenosis at L4-5
remains despite laminectomy.
15. Slightly less severe right foraminal narrowing at L4-5.
16. Broad-based disc protrusion and endplate osteophytes contribute
to moderate bilateral foraminal narrowing at L5-S1.
